# Patient Record
Sex: Female | Born: 1975 | Race: White | Hispanic: No | Marital: Single | Smoking: Current every day smoker
Health system: Southern US, Community
[De-identification: ages and names within clinical notes are randomized; demographics above are authoritative.]

## PROBLEM LIST (undated history)

## (undated) DIAGNOSIS — E785 Hyperlipidemia, unspecified: Secondary | ICD-10-CM

## (undated) DIAGNOSIS — M1990 Unspecified osteoarthritis, unspecified site: Secondary | ICD-10-CM

## (undated) DIAGNOSIS — R7303 Prediabetes: Secondary | ICD-10-CM

## (undated) DIAGNOSIS — E119 Type 2 diabetes mellitus without complications: Secondary | ICD-10-CM

## (undated) DIAGNOSIS — I10 Essential (primary) hypertension: Secondary | ICD-10-CM

---

## 1998-11-16 ENCOUNTER — Other Ambulatory Visit: Admission: RE | Admit: 1998-11-16 | Discharge: 1998-11-16 | Payer: Self-pay | Admitting: Family Medicine

## 2000-04-16 ENCOUNTER — Other Ambulatory Visit: Admission: RE | Admit: 2000-04-16 | Discharge: 2000-04-16 | Payer: Self-pay | Admitting: Obstetrics and Gynecology

## 2000-05-24 ENCOUNTER — Emergency Department (HOSPITAL_COMMUNITY): Admission: EM | Admit: 2000-05-24 | Discharge: 2000-05-24 | Payer: Self-pay | Admitting: Emergency Medicine

## 2000-07-04 ENCOUNTER — Emergency Department (HOSPITAL_COMMUNITY): Admission: EM | Admit: 2000-07-04 | Discharge: 2000-07-04 | Payer: Self-pay | Admitting: Emergency Medicine

## 2002-08-05 ENCOUNTER — Other Ambulatory Visit: Admission: RE | Admit: 2002-08-05 | Discharge: 2002-08-05 | Payer: Self-pay | Admitting: Gynecology

## 2004-05-11 ENCOUNTER — Ambulatory Visit: Payer: Self-pay | Admitting: Family Medicine

## 2004-06-21 ENCOUNTER — Ambulatory Visit: Payer: Self-pay | Admitting: Family Medicine

## 2005-09-11 ENCOUNTER — Emergency Department (HOSPITAL_COMMUNITY): Admission: EM | Admit: 2005-09-11 | Discharge: 2005-09-11 | Payer: Self-pay | Admitting: Family Medicine

## 2005-12-30 ENCOUNTER — Ambulatory Visit: Payer: Self-pay | Admitting: Internal Medicine

## 2006-05-05 ENCOUNTER — Emergency Department (HOSPITAL_COMMUNITY): Admission: EM | Admit: 2006-05-05 | Discharge: 2006-05-05 | Payer: Self-pay | Admitting: Family Medicine

## 2006-06-04 ENCOUNTER — Emergency Department (HOSPITAL_COMMUNITY): Admission: EM | Admit: 2006-06-04 | Discharge: 2006-06-04 | Payer: Self-pay | Admitting: Family Medicine

## 2006-09-11 ENCOUNTER — Other Ambulatory Visit: Admission: RE | Admit: 2006-09-11 | Discharge: 2006-09-11 | Payer: Self-pay | Admitting: Gynecology

## 2006-10-10 ENCOUNTER — Encounter: Payer: Self-pay | Admitting: Internal Medicine

## 2006-10-13 ENCOUNTER — Ambulatory Visit: Payer: Self-pay | Admitting: Family Medicine

## 2006-10-13 DIAGNOSIS — F329 Major depressive disorder, single episode, unspecified: Secondary | ICD-10-CM

## 2006-10-13 DIAGNOSIS — F3289 Other specified depressive episodes: Secondary | ICD-10-CM | POA: Insufficient documentation

## 2006-10-15 ENCOUNTER — Telehealth (INDEPENDENT_AMBULATORY_CARE_PROVIDER_SITE_OTHER): Payer: Self-pay | Admitting: Internal Medicine

## 2006-11-05 ENCOUNTER — Ambulatory Visit: Payer: Self-pay | Admitting: Family Medicine

## 2006-11-29 ENCOUNTER — Emergency Department (HOSPITAL_COMMUNITY): Admission: EM | Admit: 2006-11-29 | Discharge: 2006-11-29 | Payer: Self-pay | Admitting: Emergency Medicine

## 2006-12-11 ENCOUNTER — Telehealth (INDEPENDENT_AMBULATORY_CARE_PROVIDER_SITE_OTHER): Payer: Self-pay | Admitting: *Deleted

## 2006-12-11 ENCOUNTER — Ambulatory Visit: Payer: Self-pay | Admitting: Family Medicine

## 2006-12-16 ENCOUNTER — Telehealth (INDEPENDENT_AMBULATORY_CARE_PROVIDER_SITE_OTHER): Payer: Self-pay | Admitting: *Deleted

## 2006-12-17 ENCOUNTER — Encounter (INDEPENDENT_AMBULATORY_CARE_PROVIDER_SITE_OTHER): Payer: Self-pay | Admitting: Internal Medicine

## 2006-12-17 ENCOUNTER — Ambulatory Visit: Payer: Self-pay | Admitting: *Deleted

## 2006-12-17 ENCOUNTER — Telehealth (INDEPENDENT_AMBULATORY_CARE_PROVIDER_SITE_OTHER): Payer: Self-pay | Admitting: *Deleted

## 2006-12-31 ENCOUNTER — Ambulatory Visit: Payer: Self-pay | Admitting: *Deleted

## 2007-01-01 ENCOUNTER — Ambulatory Visit: Payer: Self-pay | Admitting: Family Medicine

## 2007-01-01 DIAGNOSIS — F411 Generalized anxiety disorder: Secondary | ICD-10-CM | POA: Insufficient documentation

## 2007-01-07 ENCOUNTER — Ambulatory Visit: Payer: Self-pay | Admitting: *Deleted

## 2007-02-02 ENCOUNTER — Encounter (INDEPENDENT_AMBULATORY_CARE_PROVIDER_SITE_OTHER): Payer: Self-pay | Admitting: *Deleted

## 2007-03-04 ENCOUNTER — Telehealth (INDEPENDENT_AMBULATORY_CARE_PROVIDER_SITE_OTHER): Payer: Self-pay | Admitting: Internal Medicine

## 2007-05-21 ENCOUNTER — Encounter (INDEPENDENT_AMBULATORY_CARE_PROVIDER_SITE_OTHER): Payer: Self-pay | Admitting: *Deleted

## 2007-10-31 ENCOUNTER — Emergency Department (HOSPITAL_COMMUNITY): Admission: EM | Admit: 2007-10-31 | Discharge: 2007-10-31 | Payer: Self-pay | Admitting: Emergency Medicine

## 2008-01-18 ENCOUNTER — Emergency Department (HOSPITAL_COMMUNITY): Admission: EM | Admit: 2008-01-18 | Discharge: 2008-01-18 | Payer: Self-pay | Admitting: Family Medicine

## 2008-02-16 ENCOUNTER — Ambulatory Visit: Payer: Self-pay | Admitting: Family Medicine

## 2008-02-16 DIAGNOSIS — R030 Elevated blood-pressure reading, without diagnosis of hypertension: Secondary | ICD-10-CM | POA: Insufficient documentation

## 2008-02-18 ENCOUNTER — Telehealth (INDEPENDENT_AMBULATORY_CARE_PROVIDER_SITE_OTHER): Payer: Self-pay | Admitting: Internal Medicine

## 2008-02-18 ENCOUNTER — Encounter (INDEPENDENT_AMBULATORY_CARE_PROVIDER_SITE_OTHER): Payer: Self-pay | Admitting: Internal Medicine

## 2008-03-01 ENCOUNTER — Ambulatory Visit: Payer: Self-pay | Admitting: Family Medicine

## 2008-03-08 ENCOUNTER — Ambulatory Visit: Payer: Self-pay | Admitting: Family Medicine

## 2008-03-08 ENCOUNTER — Telehealth (INDEPENDENT_AMBULATORY_CARE_PROVIDER_SITE_OTHER): Payer: Self-pay | Admitting: Internal Medicine

## 2008-03-08 ENCOUNTER — Encounter (INDEPENDENT_AMBULATORY_CARE_PROVIDER_SITE_OTHER): Payer: Self-pay | Admitting: Internal Medicine

## 2008-03-11 ENCOUNTER — Ambulatory Visit: Payer: Self-pay | Admitting: Licensed Clinical Social Worker

## 2008-03-17 ENCOUNTER — Ambulatory Visit: Payer: Self-pay | Admitting: Licensed Clinical Social Worker

## 2008-03-22 ENCOUNTER — Ambulatory Visit: Payer: Self-pay | Admitting: Family Medicine

## 2008-03-22 DIAGNOSIS — I10 Essential (primary) hypertension: Secondary | ICD-10-CM | POA: Insufficient documentation

## 2008-04-21 ENCOUNTER — Ambulatory Visit: Payer: Self-pay | Admitting: Family Medicine

## 2008-09-22 ENCOUNTER — Ambulatory Visit: Payer: Self-pay | Admitting: Family Medicine

## 2008-09-22 DIAGNOSIS — L255 Unspecified contact dermatitis due to plants, except food: Secondary | ICD-10-CM | POA: Insufficient documentation

## 2008-09-22 LAB — CONVERTED CEMR LAB
Basophils Relative: 0.3 % (ref 0.0–3.0)
Eosinophils Absolute: 0.6 10*3/uL (ref 0.0–0.7)
Eosinophils Relative: 3.6 % (ref 0.0–5.0)
Hemoglobin: 13.3 g/dL (ref 12.0–15.0)
Lymphocytes Relative: 15.3 % (ref 12.0–46.0)
MCHC: 34.3 g/dL (ref 30.0–36.0)
MCV: 87.2 fL (ref 78.0–100.0)
Monocytes Absolute: 0.1 10*3/uL (ref 0.1–1.0)
Neutro Abs: 12.9 10*3/uL — ABNORMAL HIGH (ref 1.4–7.7)
RBC: 4.44 M/uL (ref 3.87–5.11)
WBC: 16 10*3/uL — ABNORMAL HIGH (ref 4.5–10.5)

## 2008-09-26 ENCOUNTER — Ambulatory Visit: Payer: Self-pay | Admitting: Family Medicine

## 2011-01-31 LAB — POCT URINALYSIS DIP (DEVICE)
Nitrite: NEGATIVE
Specific Gravity, Urine: 1.015
pH: 7

## 2011-01-31 LAB — URINE CULTURE: Colony Count: NO GROWTH

## 2011-02-06 LAB — POCT RAPID STREP A: Streptococcus, Group A Screen (Direct): NEGATIVE

## 2011-06-01 ENCOUNTER — Emergency Department (HOSPITAL_COMMUNITY)
Admission: EM | Admit: 2011-06-01 | Discharge: 2011-06-01 | Disposition: A | Discharge status: Home / Self Care | Payer: Managed Care, Other (non HMO) | Source: Home / Self Care | Attending: Emergency Medicine | Admitting: Emergency Medicine

## 2011-06-01 ENCOUNTER — Encounter (HOSPITAL_COMMUNITY): Payer: Self-pay | Admitting: Emergency Medicine

## 2011-06-01 DIAGNOSIS — J329 Chronic sinusitis, unspecified: Secondary | ICD-10-CM

## 2011-06-01 DIAGNOSIS — N3090 Cystitis, unspecified without hematuria: Secondary | ICD-10-CM

## 2011-06-01 HISTORY — DX: Essential (primary) hypertension: I10

## 2011-06-01 LAB — POCT URINALYSIS DIP (DEVICE)
Glucose, UA: NEGATIVE mg/dL
Nitrite: NEGATIVE
Protein, ur: NEGATIVE mg/dL
Urobilinogen, UA: 0.2 mg/dL (ref 0.0–1.0)

## 2011-06-01 LAB — POCT PREGNANCY, URINE: Preg Test, Ur: NEGATIVE

## 2011-06-01 MED ORDER — FLUTICASONE PROPIONATE 50 MCG/ACT NA SUSP: 2.0000 | Freq: Every day | NASAL | Status: DC

## 2011-06-01 MED ORDER — PHENAZOPYRIDINE HCL 200 MG PO TABS: 200.0000 mg | ORAL_TABLET | Freq: Three times a day (TID) | ORAL | Status: AC | PRN

## 2011-06-01 MED ORDER — PSEUDOEPHEDRINE-GUAIFENESIN ER 120-1200 MG PO TB12: 1.0000 | ORAL_TABLET | Freq: Two times a day (BID) | ORAL | Status: DC

## 2011-06-01 MED ORDER — SULFAMETHOXAZOLE-TRIMETHOPRIM 800-160 MG PO TABS: 1.0000 | ORAL_TABLET | Freq: Two times a day (BID) | ORAL | Status: AC

## 2011-06-01 NOTE — ED Provider Notes (Signed)
History     CSN: [id number]  Arrival date & time 06/01/11  1201   First MD Initiated Contact with Patient 06/01/11 1248      Chief Complaint  Patient presents with  . Otalgia    (Consider location/radiation/quality/duration/timing/severity/associated sxs/prior treatment) HPI Comments: Patient with nasal congestion, rhinorrhea, frontal sinus pain/pressure, ear fullness, and bilateral ear pain over the past 3 days. Some postnasal drip and throat irritation. No purulent nasal discharge. No fevers, dental pain, nausea, vomiting, coughing, wheezing. Tried Alka-Seltzer sinus without relief. States this feels identical to previous sinus infections. Patient also reports 2 days of urinary urgency, frequency, dysuria, occasional lower back pain. No hematuria, cloudy, oderous urine. No fevers, vaginal bleeding, genital ulcers, vaginal discharge, vulvar itching, abdominal pain, fevers. No recent antibiotics. Patient states she has not been sexually active in over a year and a half. STDs no concern today. States this feels identical to previous UTIs. Has not tried anything for this.  ROS as noted in HPI. All other ROS negative.   Patient is a 36 y.o. female presenting with sinusitis and frequency. The history is provided by the patient. No language interpreter was used.  Sinusitis  This is a new problem. The current episode started more than 2 days ago. There has been no fever.  Urinary Frequency This is a new problem. The current episode started yesterday.    Past Medical History  Diagnosis Date  . Hypertension     History reviewed. No pertinent past surgical history.  History reviewed. No pertinent family history.  History  Substance Use Topics  . Smoking status: Current Everyday Smoker  . Smokeless tobacco: Not on file  . Alcohol Use: No    OB History    Grav Para Term Preterm Abortions TAB SAB Ect Mult Living                  Review of Systems  Genitourinary: Positive for  frequency.    Allergies  Codeine sulfate; Latex; and Penicillins  Home Medications   Current Outpatient Rx  Name Route Sig Dispense Refill  . FLUTICASONE PROPIONATE 50 MCG/ACT NA SUSP Nasal Place 2 sprays into the nose daily. 16 g 0  . PHENAZOPYRIDINE HCL 200 MG PO TABS Oral Take 1 tablet (200 mg total) by mouth 3 (three) times daily as needed for pain. 6 tablet 0  . PSEUDOEPHEDRINE-GUAIFENESIN ER [phone] MG PO TB12 Oral Take 1 tablet by mouth 2 (two) times daily. 20 each 0  . SULFAMETHOXAZOLE-TRIMETHOPRIM 800-160 MG PO TABS Oral Take 1 tablet by mouth 2 (two) times daily. 6 tablet 0    BP 119/83  Pulse 78  Temp(Src) 98.6 F (37 C) (Oral)  Resp 16  SpO2 100%  LMP 03/01/2011  Physical Exam  Nursing note and vitals reviewed. Constitutional: She is oriented to person, place, and time. She appears well-developed and well-nourished.  HENT:  Head: Normocephalic and atraumatic.  Right Ear: Tympanic membrane normal.  Left Ear: Tympanic membrane normal.  Nose: Mucosal edema and rhinorrhea present. No epistaxis. Right sinus exhibits maxillary sinus tenderness. Right sinus exhibits no frontal sinus tenderness. Left sinus exhibits maxillary sinus tenderness. Left sinus exhibits no frontal sinus tenderness.  Mouth/Throat: Uvula is midline and mucous membranes are normal. Posterior oropharyngeal erythema present. No oropharyngeal exudate.       Serous effusion left TM  Eyes: Conjunctivae and EOM are normal.  Neck: Normal range of motion. Neck supple.  Cardiovascular: Normal rate, regular rhythm and normal heart sounds.  Pulmonary/Chest: Effort normal and breath sounds normal. No respiratory distress. She has no wheezes. She has no rales.  Abdominal: Soft. Bowel sounds are normal. She exhibits no distension. There is tenderness in the suprapubic area. There is no rebound, no guarding and no CVA tenderness.  Musculoskeletal: Normal range of motion.  Lymphadenopathy:    She has no cervical  adenopathy.  Neurological: She is alert and oriented to person, place, and time.  Skin: Skin is warm and dry. No rash noted.  Psychiatric: She has a normal mood and affect. Her behavior is normal. Judgment and thought content normal.    ED Course  Procedures (including critical care time)  No results found.  Results for orders placed during the hospital encounter of 06/01/11  POCT URINALYSIS DIP (DEVICE)      Component Value Range   Glucose, UA NEGATIVE  NEGATIVE (mg/dL)   Bilirubin Urine SMALL (*) NEGATIVE    Ketones, ur TRACE (*) NEGATIVE (mg/dL)   Specific Gravity, Urine 1.020  1.005 - 1.030    Hgb urine dipstick NEGATIVE  NEGATIVE    pH 6.0  5.0 - 8.0    Protein, ur NEGATIVE  NEGATIVE (mg/dL)   Urobilinogen, UA 0.2  0.0 - 1.0 (mg/dL)   Nitrite NEGATIVE  NEGATIVE    Leukocytes, UA NEGATIVE  NEGATIVE   POCT PREGNANCY, URINE      Component Value Range   Preg Test, Ur NEGATIVE  NEGATIVE     1. Cystitis   2. Sinusitis      MDM  Previous chart, labs reviewed. History of hematuria,. Treated for UTI in 2009, urine culture negative. No history of gonorrhea, Chlamydia, BV, trich.  Patient states that she's not actually active in over a year and a half. Has no vaginal complaints. STDs no concern today. udip noted. Will send urine off for culture, and treat as UTI. Patient also with early sinus infection. No fevers >102, has had sx for < 10 days, no h/o double sickening. No historical or objective evidence of bacterial infection. No indication for abx. Will start flonase, mucinex-d, increase fluids, nasal saline irrigation,  tylenol/motrin prn pain. Discussed MDM and plan with pt. Pt agrees with plan and will f/u with PMD prn.      Luiz Blare, MD 06/01/11 1327

## 2011-06-01 NOTE — ED Notes (Signed)
Onset 3 days ago of sinus headache and sinus drainage, then ear hurting, then noted lower back pain and frequent urination.  Denies urinary pain.  Pain predominantly right lower back

## 2011-06-02 LAB — URINE CULTURE

## 2012-09-04 ENCOUNTER — Other Ambulatory Visit (HOSPITAL_COMMUNITY)
Admission: RE | Admit: 2012-09-04 | Discharge: 2012-09-04 | Disposition: A | Discharge status: Home / Self Care | Payer: Managed Care, Other (non HMO) | Source: Ambulatory Visit | Attending: Obstetrics and Gynecology | Admitting: Obstetrics and Gynecology

## 2012-09-04 ENCOUNTER — Other Ambulatory Visit: Payer: Self-pay | Admitting: Obstetrics and Gynecology

## 2012-09-04 DIAGNOSIS — Z1151 Encounter for screening for human papillomavirus (HPV): Secondary | ICD-10-CM | POA: Insufficient documentation

## 2012-09-04 DIAGNOSIS — Z01419 Encounter for gynecological examination (general) (routine) without abnormal findings: Secondary | ICD-10-CM | POA: Insufficient documentation

## 2012-09-04 DIAGNOSIS — R87810 Cervical high risk human papillomavirus (HPV) DNA test positive: Secondary | ICD-10-CM | POA: Insufficient documentation

## 2013-01-01 ENCOUNTER — Encounter: Payer: Self-pay | Admitting: Dietician

## 2013-01-01 ENCOUNTER — Encounter: Payer: Managed Care, Other (non HMO) | Attending: Gynecology | Admitting: Dietician

## 2013-01-01 VITALS — Ht 66.0 in | Wt 247.1 lb

## 2013-01-01 DIAGNOSIS — R7309 Other abnormal glucose: Secondary | ICD-10-CM | POA: Insufficient documentation

## 2013-01-01 DIAGNOSIS — Z713 Dietary counseling and surveillance: Secondary | ICD-10-CM | POA: Insufficient documentation

## 2013-01-01 NOTE — Patient Instructions (Addendum)
Consider eating 3 meals and 2-3 snacks per day.  Try to eat every 3-5 hours you are awake. Eat protein with carbohydrates for snacks. Fill half of your plate with vegetables and limit starch to a quarter of the plate. Switch Coke to water or coffee. (Try seltzer) Aim to get 30 minutes of exercise 5 days a week.

## 2013-01-01 NOTE — Progress Notes (Signed)
  Medical Nutrition Therapy:  Appt start time: 0900 end time:  1000.   Assessment:  Primary concerns today: Kelsey Cuevas is here today because her doctor recommended that she talk to a dietitian about her high blood sugar levels (pre-diabetes). Her Hgb A1C was 6.3% in early August.   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 States that her feet swell when she sits on day at work and does not have swelling when she is moving more on the weekend.   States that she doesn't feel hungry during the day but will feel full if she eats too much. States that she doesn't usually eat too much but feels that she eats too many convenience foods.   MEDICATIONS: see list   DIETARY INTAKE:  Usual eating pattern includes 2 meals and 0 snacks per day.  24-hr recall:  B ( AM): usually instant oatmeal with coke   Snk ( AM): rarely  L ( PM): pasta OR sandwich with ham and cheese and chips with coke Snk ( PM): none D ( PM): sometimes bowl of cereal  Snk ( PM): none Beverages: 3 cokes (16 oz each) and drinks ~32-48 oz water   Usual physical activity: nothing M-Thursday, 30 min walks or more activities such as gardening or swimming on weekend   Estimated energy needs: 1800 calories 200 g carbohydrates 135 g protein 50 g fat  Progress Towards Goal(s):  In progress.   Nutritional Diagnosis:  NB-1.1 Food and nutrition-related knowledge deficit As related to excess soda consumption and meal skipping.  As evidenced by diet recall and Hgb A1C of 6.3% .    Intervention:  Nutrition counseling provided. Discussed the pathophysiology of insulin resistance and factors that affect blood sugar such as carbohydrate foods, weight gain, stress, and exercise. Recommended eating more frequently to distribute carbohydrates throughout the day. Recommended limiting coke and adding exercise wherever possible during the day.    Plan: Consider eating 3 meals and 2-3 snacks per day.  Try to eat every 3-5 hours you are awake. Eat protein with carbohydrates for snacks. Fill half of your plate with vegetables and limit starch to a quarter of the plate. Switch Coke to water or coffee. (Try seltzer) Aim to get 30 minutes of exercise 5 days a week.   Handouts given during visit include:  Living Well with Diabetes  15g CHO Snacks  MyPlate Handout  Monitoring/Evaluation:  Dietary intake, exercise, and body weight in 6 week(s).

## 2013-01-25 ENCOUNTER — Other Ambulatory Visit: Payer: Self-pay | Admitting: Internal Medicine

## 2013-01-25 DIAGNOSIS — E221 Hyperprolactinemia: Secondary | ICD-10-CM

## 2013-01-29 ENCOUNTER — Ambulatory Visit
Admission: RE | Admit: 2013-01-29 | Discharge: 2013-01-29 | Disposition: A | Discharge status: Home / Self Care | Payer: Managed Care, Other (non HMO) | Source: Ambulatory Visit | Attending: Internal Medicine | Admitting: Internal Medicine

## 2013-01-29 DIAGNOSIS — E221 Hyperprolactinemia: Secondary | ICD-10-CM

## 2013-01-29 MED ORDER — GADOBENATE DIMEGLUMINE 529 MG/ML IV SOLN
10.0000 mL | Freq: Once | INTRAVENOUS | Status: AC | PRN
  Administered 2013-01-29: 10 mL via INTRAVENOUS

## 2013-02-12 ENCOUNTER — Ambulatory Visit: Payer: Managed Care, Other (non HMO) | Admitting: Dietician

## 2013-07-13 ENCOUNTER — Other Ambulatory Visit: Payer: Self-pay | Admitting: Nurse Practitioner

## 2013-07-13 ENCOUNTER — Ambulatory Visit
Admission: RE | Admit: 2013-07-13 | Discharge: 2013-07-13 | Disposition: A | Discharge status: Home / Self Care | Payer: Managed Care, Other (non HMO) | Source: Ambulatory Visit | Attending: Nurse Practitioner | Admitting: Nurse Practitioner

## 2013-07-13 DIAGNOSIS — M25511 Pain in right shoulder: Secondary | ICD-10-CM

## 2013-09-24 ENCOUNTER — Other Ambulatory Visit: Payer: Self-pay | Admitting: Internal Medicine

## 2013-09-24 DIAGNOSIS — Z1231 Encounter for screening mammogram for malignant neoplasm of breast: Secondary | ICD-10-CM

## 2013-10-08 ENCOUNTER — Ambulatory Visit
Admission: RE | Admit: 2013-10-08 | Discharge: 2013-10-08 | Disposition: A | Discharge status: Home / Self Care | Payer: Managed Care, Other (non HMO) | Source: Ambulatory Visit | Attending: Internal Medicine | Admitting: Internal Medicine

## 2013-10-08 ENCOUNTER — Encounter (INDEPENDENT_AMBULATORY_CARE_PROVIDER_SITE_OTHER): Payer: Self-pay

## 2013-10-08 DIAGNOSIS — Z1231 Encounter for screening mammogram for malignant neoplasm of breast: Secondary | ICD-10-CM

## 2013-10-11 ENCOUNTER — Other Ambulatory Visit: Payer: Self-pay | Admitting: Internal Medicine

## 2013-10-11 DIAGNOSIS — R928 Other abnormal and inconclusive findings on diagnostic imaging of breast: Secondary | ICD-10-CM

## 2013-10-15 ENCOUNTER — Other Ambulatory Visit: Payer: Self-pay | Admitting: Obstetrics and Gynecology

## 2013-10-15 ENCOUNTER — Ambulatory Visit
Admission: RE | Admit: 2013-10-15 | Discharge: 2013-10-15 | Disposition: A | Discharge status: Home / Self Care | Payer: Private Health Insurance - Indemnity | Source: Ambulatory Visit | Attending: Internal Medicine | Admitting: Internal Medicine

## 2013-10-15 ENCOUNTER — Other Ambulatory Visit (HOSPITAL_COMMUNITY)
Admission: RE | Admit: 2013-10-15 | Discharge: 2013-10-15 | Disposition: A | Discharge status: Home / Self Care | Payer: Private Health Insurance - Indemnity | Source: Ambulatory Visit | Attending: Obstetrics and Gynecology | Admitting: Obstetrics and Gynecology

## 2013-10-15 DIAGNOSIS — R87810 Cervical high risk human papillomavirus (HPV) DNA test positive: Secondary | ICD-10-CM | POA: Insufficient documentation

## 2013-10-15 DIAGNOSIS — Z1151 Encounter for screening for human papillomavirus (HPV): Secondary | ICD-10-CM | POA: Insufficient documentation

## 2013-10-15 DIAGNOSIS — R928 Other abnormal and inconclusive findings on diagnostic imaging of breast: Secondary | ICD-10-CM

## 2013-10-15 DIAGNOSIS — Z01419 Encounter for gynecological examination (general) (routine) without abnormal findings: Secondary | ICD-10-CM | POA: Insufficient documentation

## 2013-10-19 LAB — CYTOLOGY - PAP

## 2015-08-20 ENCOUNTER — Emergency Department (HOSPITAL_COMMUNITY): Payer: Managed Care, Other (non HMO)

## 2015-08-20 ENCOUNTER — Encounter (HOSPITAL_COMMUNITY): Payer: Self-pay | Admitting: Oncology

## 2015-08-20 ENCOUNTER — Emergency Department (HOSPITAL_COMMUNITY)
Admission: EM | Admit: 2015-08-20 | Discharge: 2015-08-20 | Disposition: A | Discharge status: Home / Self Care | Payer: Managed Care, Other (non HMO) | Attending: Emergency Medicine | Admitting: Emergency Medicine

## 2015-08-20 DIAGNOSIS — R0789 Other chest pain: Secondary | ICD-10-CM

## 2015-08-20 DIAGNOSIS — E785 Hyperlipidemia, unspecified: Secondary | ICD-10-CM | POA: Diagnosis not present

## 2015-08-20 DIAGNOSIS — I10 Essential (primary) hypertension: Secondary | ICD-10-CM | POA: Diagnosis not present

## 2015-08-20 DIAGNOSIS — Z79899 Other long term (current) drug therapy: Secondary | ICD-10-CM | POA: Insufficient documentation

## 2015-08-20 DIAGNOSIS — R1012 Left upper quadrant pain: Secondary | ICD-10-CM | POA: Diagnosis not present

## 2015-08-20 DIAGNOSIS — R1013 Epigastric pain: Secondary | ICD-10-CM | POA: Insufficient documentation

## 2015-08-20 DIAGNOSIS — F17200 Nicotine dependence, unspecified, uncomplicated: Secondary | ICD-10-CM | POA: Diagnosis not present

## 2015-08-20 DIAGNOSIS — Z91040 Latex allergy status: Secondary | ICD-10-CM | POA: Insufficient documentation

## 2015-08-20 DIAGNOSIS — R079 Chest pain, unspecified: Secondary | ICD-10-CM | POA: Diagnosis present

## 2015-08-20 DIAGNOSIS — Z880 Allergy status to penicillin: Secondary | ICD-10-CM | POA: Diagnosis not present

## 2015-08-20 DIAGNOSIS — E119 Type 2 diabetes mellitus without complications: Secondary | ICD-10-CM | POA: Insufficient documentation

## 2015-08-20 HISTORY — DX: Type 2 diabetes mellitus without complications: E11.9

## 2015-08-20 HISTORY — DX: Hyperlipidemia, unspecified: E78.5

## 2015-08-20 LAB — CBC
HEMATOCRIT: 42.9 % (ref 36.0–46.0)
Hemoglobin: 14.1 g/dL (ref 12.0–15.0)
MCH: 29.2 pg (ref 26.0–34.0)
MCHC: 32.9 g/dL (ref 30.0–36.0)
MCV: 88.8 fL (ref 78.0–100.0)
PLATELETS: 334 10*3/uL (ref 150–400)
RBC: 4.83 MIL/uL (ref 3.87–5.11)
RDW: 14.9 % (ref 11.5–15.5)
WBC: 13.1 10*3/uL — AB (ref 4.0–10.5)

## 2015-08-20 LAB — LIPASE, BLOOD: LIPASE: 43 U/L (ref 11–51)

## 2015-08-20 LAB — BASIC METABOLIC PANEL
Anion gap: 7 (ref 5–15)
BUN: 12 mg/dL (ref 6–20)
CO2: 24 mmol/L (ref 22–32)
Calcium: 9.2 mg/dL (ref 8.9–10.3)
Chloride: 107 mmol/L (ref 101–111)
Creatinine, Ser: 0.82 mg/dL (ref 0.44–1.00)
Glucose, Bld: 112 mg/dL — ABNORMAL HIGH (ref 65–99)
POTASSIUM: 3.8 mmol/L (ref 3.5–5.1)
SODIUM: 138 mmol/L (ref 135–145)

## 2015-08-20 LAB — I-STAT TROPONIN, ED
TROPONIN I, POC: 0 ng/mL (ref 0.00–0.08)
Troponin i, poc: 0 ng/mL (ref 0.00–0.08)

## 2015-08-20 MED ORDER — FAMOTIDINE IN NACL 20-0.9 MG/50ML-% IV SOLN
20.0000 mg | Freq: Once | INTRAVENOUS | Status: AC
  Administered 2015-08-20: 20 mg via INTRAVENOUS
  Filled 2015-08-20: qty 50

## 2015-08-20 MED ORDER — KETOROLAC TROMETHAMINE 30 MG/ML IJ SOLN
30.0000 mg | Freq: Once | INTRAMUSCULAR | Status: AC
  Administered 2015-08-20: 30 mg via INTRAVENOUS
  Filled 2015-08-20: qty 1

## 2015-08-20 MED ORDER — GI COCKTAIL ~~LOC~~
30.0000 mL | Freq: Once | ORAL | Status: AC
  Administered 2015-08-20: 30 mL via ORAL
  Filled 2015-08-20: qty 30

## 2015-08-20 MED ORDER — SODIUM CHLORIDE 0.9 % IV BOLUS (SEPSIS)
1000.0000 mL | Freq: Once | INTRAVENOUS | Status: AC
  Administered 2015-08-20: 1000 mL via INTRAVENOUS

## 2015-08-20 NOTE — ED Notes (Signed)
Pt has been experiencing intermittent left chest, (under left breast) pain x 3 weeks.  Last night pt reports being dizzy.  Today pt states that the pain has been constant.  Rates pain 8/10, sharp and stabbing in nature.  Swelling noted under left breast.  Denies radiation of pain, nausea, diaphoresis or weakness.

## 2015-08-20 NOTE — Discharge Instructions (Signed)

## 2015-08-20 NOTE — ED Provider Notes (Signed)
CSN: [id number]     Arrival date & time 08/20/15  1927 History   First MD Initiated Contact with Patient 08/20/15 2022     Chief Complaint  Patient presents with  . Chest Pain   HPI Comments: 40 year old female presents with chest pain. She states she has felt a pressure under her L breast for the past 3 weeks. She thought it was a gas pain however has not tried any medicines to help make it better. The pain has been intermittent however in the past day it has become constant. It does not radiate. It is not positional or worse with movement. She has never had this before. PMH significant for HTN, HLD, and pre-diabetes. She smokes 1 pack a day. No immediate family hx of CAD. Denies fever, chills, SOB, cough, abdominal pain, N/V/D.   Patient is a 40 y.o. female presenting with chest pain.  Chest Pain Associated symptoms: no abdominal pain, no cough, no fever, no nausea, no shortness of breath and not vomiting     Past Medical History  Diagnosis Date  . Hypertension   . Hyperlipidemia   . Diabetes mellitus without complication (HCC)     states she was pre diabetic   History reviewed. No pertinent past surgical history. Family History  Problem Relation Age of Onset  . COPD Other   . Hypertension Other   . GI Disease Other    Social History  Substance Use Topics  . Smoking status: Current Every Day Smoker  . Smokeless tobacco: Never Used  . Alcohol Use: Yes     Comment: rare   OB History    No data available     Review of Systems  Constitutional: Negative for fever and chills.  Respiratory: Negative for cough and shortness of breath.   Cardiovascular: Positive for chest pain.  Gastrointestinal: Negative for nausea, vomiting, abdominal pain and diarrhea.  Genitourinary: Negative for dysuria.  All other systems reviewed and are negative.   Allergies  Codeine sulfate; Latex; and Penicillins  Home Medications   Prior to Admission medications   Medication Sig Start Date End  Date Taking? Authorizing Provider  atorvastatin (LIPITOR) 10 MG tablet Take 10 mg by mouth daily. 06/06/15  Yes Historical Provider, MD  cabergoline (DOSTINEX) 0.5 MG tablet Take 0.25 mg by mouth 2 (two) times a week. Takes on Wed and Sat.   Yes Historical Provider, MD  losartan (COZAAR) 25 MG tablet Take 25 mg by mouth daily. 06/06/15  Yes Historical Provider, MD  Multiple Vitamins-Minerals (MULTIVITAMIN & MINERAL PO) Take 1 tablet by mouth daily.   Yes Historical Provider, MD  Vitamin D, Cholecalciferol, 400 units CHEW Chew 1 tablet by mouth daily.   Yes Historical Provider, MD  fluticasone (FLONASE) 50 MCG/ACT nasal spray Place 2 sprays into the nose daily. 06/01/11 05/31/12  Domenick GongAshley Mortenson, MD  Pseudoephedrine-Guaifenesin (MUCINEX D) [phone] MG TB12 Take 1 tablet by mouth 2 (two) times daily. Patient not taking: Reported on 08/20/2015 06/01/11   Domenick GongAshley Mortenson, MD   BP 118/91 mmHg  Pulse 86  Temp(Src) 98.4 F (36.9 C) (Oral)  Resp 20  SpO2 100%  LMP 08/16/2015   Physical Exam  Constitutional: She is oriented to person, place, and time. She appears well-developed and well-nourished. No distress.  HENT:  Head: Normocephalic and atraumatic.  Eyes: Conjunctivae are normal. Pupils are equal, round, and reactive to light. Right eye exhibits no discharge. Left eye exhibits no discharge. No scleral icterus.  Neck: Normal range  of motion.  Cardiovascular: Normal rate and regular rhythm.  Exam reveals no gallop and no friction rub.   No murmur heard. Pulmonary/Chest: Effort normal and breath sounds normal. No respiratory distress. She has no wheezes. She has no rales. She exhibits tenderness.  L side chest wall tenderness  Abdominal: Soft. Bowel sounds are normal. She exhibits no distension and no mass. There is tenderness. There is no rebound and no guarding.  Epigastric and LUQ pain  Neurological: She is alert and oriented to person, place, and time.  Skin: Skin is warm and dry.   Psychiatric: She has a normal mood and affect.    ED Course  Procedures (including critical care time) Labs Review Labs Reviewed  BASIC METABOLIC PANEL - Abnormal; Notable for the following:    Glucose, Bld 112 (*)    All other components within normal limits  CBC - Abnormal; Notable for the following:    WBC 13.1 (*)    All other components within normal limits  LIPASE, BLOOD  I-STAT TROPOININ, ED  Rosezena Sensor, ED    Imaging Review Dg Chest 2 View  08/20/2015  CLINICAL DATA:  Left lower chest pain with swelling for 3 weeks. No known injury. History of hypertension and pre diabetes. EXAM: CHEST  2 VIEW COMPARISON:  08/07/2012. FINDINGS: The heart size and mediastinal contours are normal. The lungs are clear. There is no pleural effusion or pneumothorax. No acute osseous findings are identified. IMPRESSION: Stable chest.  No active cardiopulmonary process. Electronically Signed   By: Carey Bullocks M.D.   On: 08/20/2015 20:58   I have personally reviewed and evaluated these images and lab results as part of my medical decision-making.   EKG Interpretation   Date/Time:  Sunday August 20 2015 20:00:08 EDT Ventricular Rate:  83 PR Interval:  121 QRS Duration: 96 QT Interval:  383 QTC Calculation: 450 R Axis:   62 Text Interpretation:  Sinus rhythm Inferior Q waves noted Lateral Q waves  noted No old tracing to compare Confirmed by NANAVATI, MD, Janey Genta [phone])  on 08/20/2015 9:19:38 PM     Meds given in ED:  Medications  sodium chloride 0.9 % bolus 1,000 mL (0 mLs Intravenous Stopped 08/20/15 2247)  famotidine (PEPCID) IVPB 20 mg premix (0 mg Intravenous Stopped 08/20/15 2203)  gi cocktail (Maalox,Lidocaine,Donnatal) (30 mLs Oral Given 08/20/15 2132)  ketorolac (TORADOL) 30 MG/ML injection 30 mg (30 mg Intravenous Given 08/20/15 2132)    Discharge Medication List as of 08/20/2015 11:10 PM       MDM   Final diagnoses:  Atypical chest pain   40 year old female who  presents with atypical chest pain for 3 weeks. Her pain is reproducible with palpation of chest wall. She is also tender in the epigastric and LUQ. GI cocktail, Pepcid, Toradol given with some relief. Discussed with patient that her symptoms are not likely coming from her heart. 2 troponins were neg with normal EKG. CXR was unremarkable. CBC showed elevation of WBC (13.1).  Patient is non-toxic, NAD, stable VS. Heart score of 2. Patient is safe for discharge. Return precautions given. Patient informed of clinical course, understand medical decision-making process, and agree with plan.   Bethel Born, PA-C 08/21/15 [id number]  Derwood Kaplan, MD 08/22/15 [phone]

## 2015-09-29 ENCOUNTER — Telehealth: Payer: Self-pay | Admitting: Oncology

## 2015-09-29 ENCOUNTER — Encounter: Payer: Self-pay | Admitting: Oncology

## 2015-09-29 NOTE — Telephone Encounter (Signed)
Dr Truett PernaSherrill reviewed packet and provided date, Verified address and insurance, faxed referring provider date and time, mailed new pt packet

## 2015-10-26 ENCOUNTER — Other Ambulatory Visit: Payer: Self-pay | Admitting: Obstetrics and Gynecology

## 2015-10-26 ENCOUNTER — Other Ambulatory Visit (HOSPITAL_COMMUNITY)
Admission: RE | Admit: 2015-10-26 | Discharge: 2015-10-26 | Disposition: A | Discharge status: Home / Self Care | Payer: Managed Care, Other (non HMO) | Source: Ambulatory Visit | Attending: Obstetrics and Gynecology | Admitting: Obstetrics and Gynecology

## 2015-10-26 DIAGNOSIS — Z01419 Encounter for gynecological examination (general) (routine) without abnormal findings: Secondary | ICD-10-CM | POA: Insufficient documentation

## 2015-10-26 DIAGNOSIS — Z1151 Encounter for screening for human papillomavirus (HPV): Secondary | ICD-10-CM | POA: Diagnosis present

## 2015-10-30 ENCOUNTER — Ambulatory Visit (HOSPITAL_BASED_OUTPATIENT_CLINIC_OR_DEPARTMENT_OTHER): Payer: Managed Care, Other (non HMO)

## 2015-10-30 ENCOUNTER — Ambulatory Visit (HOSPITAL_BASED_OUTPATIENT_CLINIC_OR_DEPARTMENT_OTHER): Payer: Managed Care, Other (non HMO) | Admitting: Oncology

## 2015-10-30 VITALS — BP 118/78 | HR 75 | Temp 98.4°F | Resp 18 | Ht 66.0 in | Wt 221.1 lb

## 2015-10-30 DIAGNOSIS — I10 Essential (primary) hypertension: Secondary | ICD-10-CM

## 2015-10-30 DIAGNOSIS — Z720 Tobacco use: Secondary | ICD-10-CM

## 2015-10-30 DIAGNOSIS — D72829 Elevated white blood cell count, unspecified: Secondary | ICD-10-CM

## 2015-10-30 LAB — CBC WITH DIFFERENTIAL/PLATELET
BASO%: 0.7 % (ref 0.0–2.0)
Basophils Absolute: 0.1 10*3/uL (ref 0.0–0.1)
EOS%: 1.5 % (ref 0.0–7.0)
Eosinophils Absolute: 0.2 10*3/uL (ref 0.0–0.5)
HEMATOCRIT: 43 % (ref 34.8–46.6)
HEMOGLOBIN: 14.2 g/dL (ref 11.6–15.9)
LYMPH#: 3.2 10*3/uL (ref 0.9–3.3)
LYMPH%: 26.1 % (ref 14.0–49.7)
MCH: 29.7 pg (ref 25.1–34.0)
MCHC: 33.1 g/dL (ref 31.5–36.0)
MCV: 89.8 fL (ref 79.5–101.0)
MONO#: 0.6 10*3/uL (ref 0.1–0.9)
MONO%: 4.8 % (ref 0.0–14.0)
NEUT%: 66.9 % (ref 38.4–76.8)
NEUTROS ABS: 8.2 10*3/uL — AB (ref 1.5–6.5)
Platelets: 325 10*3/uL (ref 145–400)
RBC: 4.79 10*6/uL (ref 3.70–5.45)
RDW: 15.2 % — AB (ref 11.2–14.5)
WBC: 12.3 10*3/uL — AB (ref 3.9–10.3)

## 2015-10-30 LAB — CHCC SMEAR

## 2015-10-30 LAB — CYTOLOGY - PAP

## 2015-10-30 LAB — DRAW EXTRA CLOT TUBE

## 2015-10-30 NOTE — Progress Notes (Signed)
[facility]   Referring ZO:[id number]:Kelsey Cuevas  Kelsey Cuevas 40 y.o.  12/08/1975    Reason for Referral: Leukocytosis   HPI: [name] is followed by Dr. Donette Cuevas for internal medicine care. She feels well. She was seen in the emergency room in April for left-sided "rib "pain. She was diagnosed with possible pleurisy and treated with a steroid Dosepak. She reports the pain has improved. She continues to have tenderness at the lower left anterior ribs. She denies trauma. When she saw Dr. Donette Cuevas on 06/07/2015 the hemoglobin returned at 14, platelets 335,000, MCV 88.4, and the white count was mildly elevated at 15.4. A repeat CBC on 09/18/2015 found the white count 11.5 with an absolute focal 7.5 and an absolute lymphocyte count of 3.3. Hepatitis C virus and HIV serologies were negative.  She reports no known history of an elevated white count. No recent infection.    Past Medical History  Diagnosis Date  . Hypertension   . Hyperlipidemia   . Diabetes mellitus without complication (HCC)     states she was pre diabetic    .  Basal cell carcinoma removed from the back in 2016   .  G0 P0,continues to have a menstrual cycle   .  Hyper prolactinemia   .  "Cortisone "injection in the right shoulder approximately 2 years ago. Past surgical history: -Wisdom tooth surgery  Medications: Reviewed  Allergies:  Allergies  Allergen Reactions  . Codeine Sulfate     REACTION: hallucinations  . Latex   . Penicillins     REACTION: Throat swelling    Family history: A maternal great aunt had breast cancer.  Social History:   [place] in [place]. She works in an office occupation. She has smoked one pack of cigarettes per day since age 40. Rare alcohol use. No transfusion history.  History  Alcohol Use  . Yes    Comment: rare    History  Smoking status  . Current Every Day Smoker  Smokeless tobacco  . Never Used      ROS:    Positives include:Thinning of the eyebrows and scalp hair, tenderness at the left anterolateral costal margin since April 2017  A complete ROS was otherwise negative.  Physical Exam:  Blood pressure 118/78, pulse 75, temperature 98.4 F (36.9 C), temperature source Oral, resp. rate 18, height 5\' 6"  (1.676 m), weight 221 lb 1.6 oz (100.29 kg), SpO2 99 %.  HEENT: Oropharynx without visible mass, neck without mass Lungs: Clear bilaterally Cardiac: Regular rate and rhythm Abdomen: No hepatosplenomegaly, no mass, nontender  Vascular: No leg edema Lymph nodes: No cervical, supraclavicular, axillary, or inguinal nodes Neurologic: Alert and oriented, the motor exam appears intact in the upper and lower extremities Skin: No rash, multiple tattoos Musculoskeletal: Mild tenderness over the low anterior ribs, no mass   LAB:  CBC: 10/30/2015-hemolymph 14.2, platelets 325,000, white count 12.3, ANC 8.2, absolute lymphocyte count 3.2 Peripheral blood smear: The platelets are normal in number, the white cell morphology is unremarkable. The majority of the white cells are mature neutrophils, no blasts. few ovalocytes. The polychromasia is not increased.   08/20/2015-WBC 13.1, hemolymph 14.1, platelets 334,000, 09/22/2008-white count 16.0, hemoglobin 13.3, platelets 372,000, ANC 12.9 CMP      Component Value Date/Time   NA 138 08/20/2015 2000   K 3.8 08/20/2015 2000   CL 107 08/20/2015 2000   CO2 24 08/20/2015 2000   GLUCOSE 112* 08/20/2015 2000  BUN 12 08/20/2015 2000   CREATININE 0.82 08/20/2015 2000   CALCIUM 9.2 08/20/2015 2000   GFRNONAA >60 08/20/2015 2000   GFRAA >60 08/20/2015 2000      Assessment/Plan:   1. Leukocytosis  Mild leukocytosis and neutrophilia on repeat CBC determinations  2.   Elevated prolactin level  3.   Hypertension  4.   Ongoing tobacco use  5.   Tenderness at the low anterolateral ribs   Disposition:   [name] is referred for evaluation  of leukocytosis. The white count has been elevated chronically based on review of the available records today. I have a low clinical suspicion for a myeloproliferative disorder or another primary hematologic condition. I suspect the leukocytosis is related to ongoing tobacco use.  We obtained a ferritin level today per the request of Dr. Dion BodyVarnado.  [name] plans to continue clinical follow-up with Dr. Donette Cuevas. I recommend a repeat CBC at the time of her yearly physical and as a follow-up if she discontinues smoking. I will be glad to see her again if she develops progressive leukocytosis or another hematologic abnormality.  She received smoking cessation materials today. She will follow-up with Dr. Donette Cuevas to evaluate the alopecia.  Thornton PapasSHERRILL, Ruddy Swire, MD  10/30/2015, 3:19 PM

## 2015-10-31 LAB — FERRITIN: Ferritin: 17 ng/ml (ref 9–269)

## 2015-11-02 ENCOUNTER — Telehealth: Payer: Self-pay | Admitting: *Deleted

## 2015-11-02 NOTE — Telephone Encounter (Signed)
-----   Message from Ladene ArtistGary B Sherrill, MD sent at 10/31/2015  6:38 PM EDT ----- Copy to gyn and Dr. Donette LarryHusain Please call her iron stores borderline low, can f/u with gyn and Dr.Husain

## 2015-11-02 NOTE — Telephone Encounter (Signed)
Per Dr. Truett PernaSherrill, pt notified that iron stores are borderline low and to f/u with GYN and Dr. Donette LarryHusain.  Pt states that her GYN has already called her and prescribed an iron pill for her to take.  Pt appreciative of call.

## 2016-08-30 ENCOUNTER — Other Ambulatory Visit: Payer: Self-pay | Admitting: Internal Medicine

## 2016-08-30 DIAGNOSIS — Z1231 Encounter for screening mammogram for malignant neoplasm of breast: Secondary | ICD-10-CM

## 2016-09-23 ENCOUNTER — Ambulatory Visit
Admission: RE | Admit: 2016-09-23 | Discharge: 2016-09-23 | Disposition: A | Discharge status: Home / Self Care | Payer: Managed Care, Other (non HMO) | Source: Ambulatory Visit | Attending: Internal Medicine | Admitting: Internal Medicine

## 2016-09-23 ENCOUNTER — Ambulatory Visit: Payer: Managed Care, Other (non HMO)

## 2016-09-23 DIAGNOSIS — Z1231 Encounter for screening mammogram for malignant neoplasm of breast: Secondary | ICD-10-CM

## 2017-03-03 IMAGING — CR DG CHEST 2V
2 series · 2 of 2 positions shown · non-contrast
Comparison: 08/07/2012.

CLINICAL DATA: Left lower chest pain with swelling for 3 weeks. No
known injury. History of hypertension and pre diabetes.

EXAM:
CHEST  2 VIEW

[w chest pa]
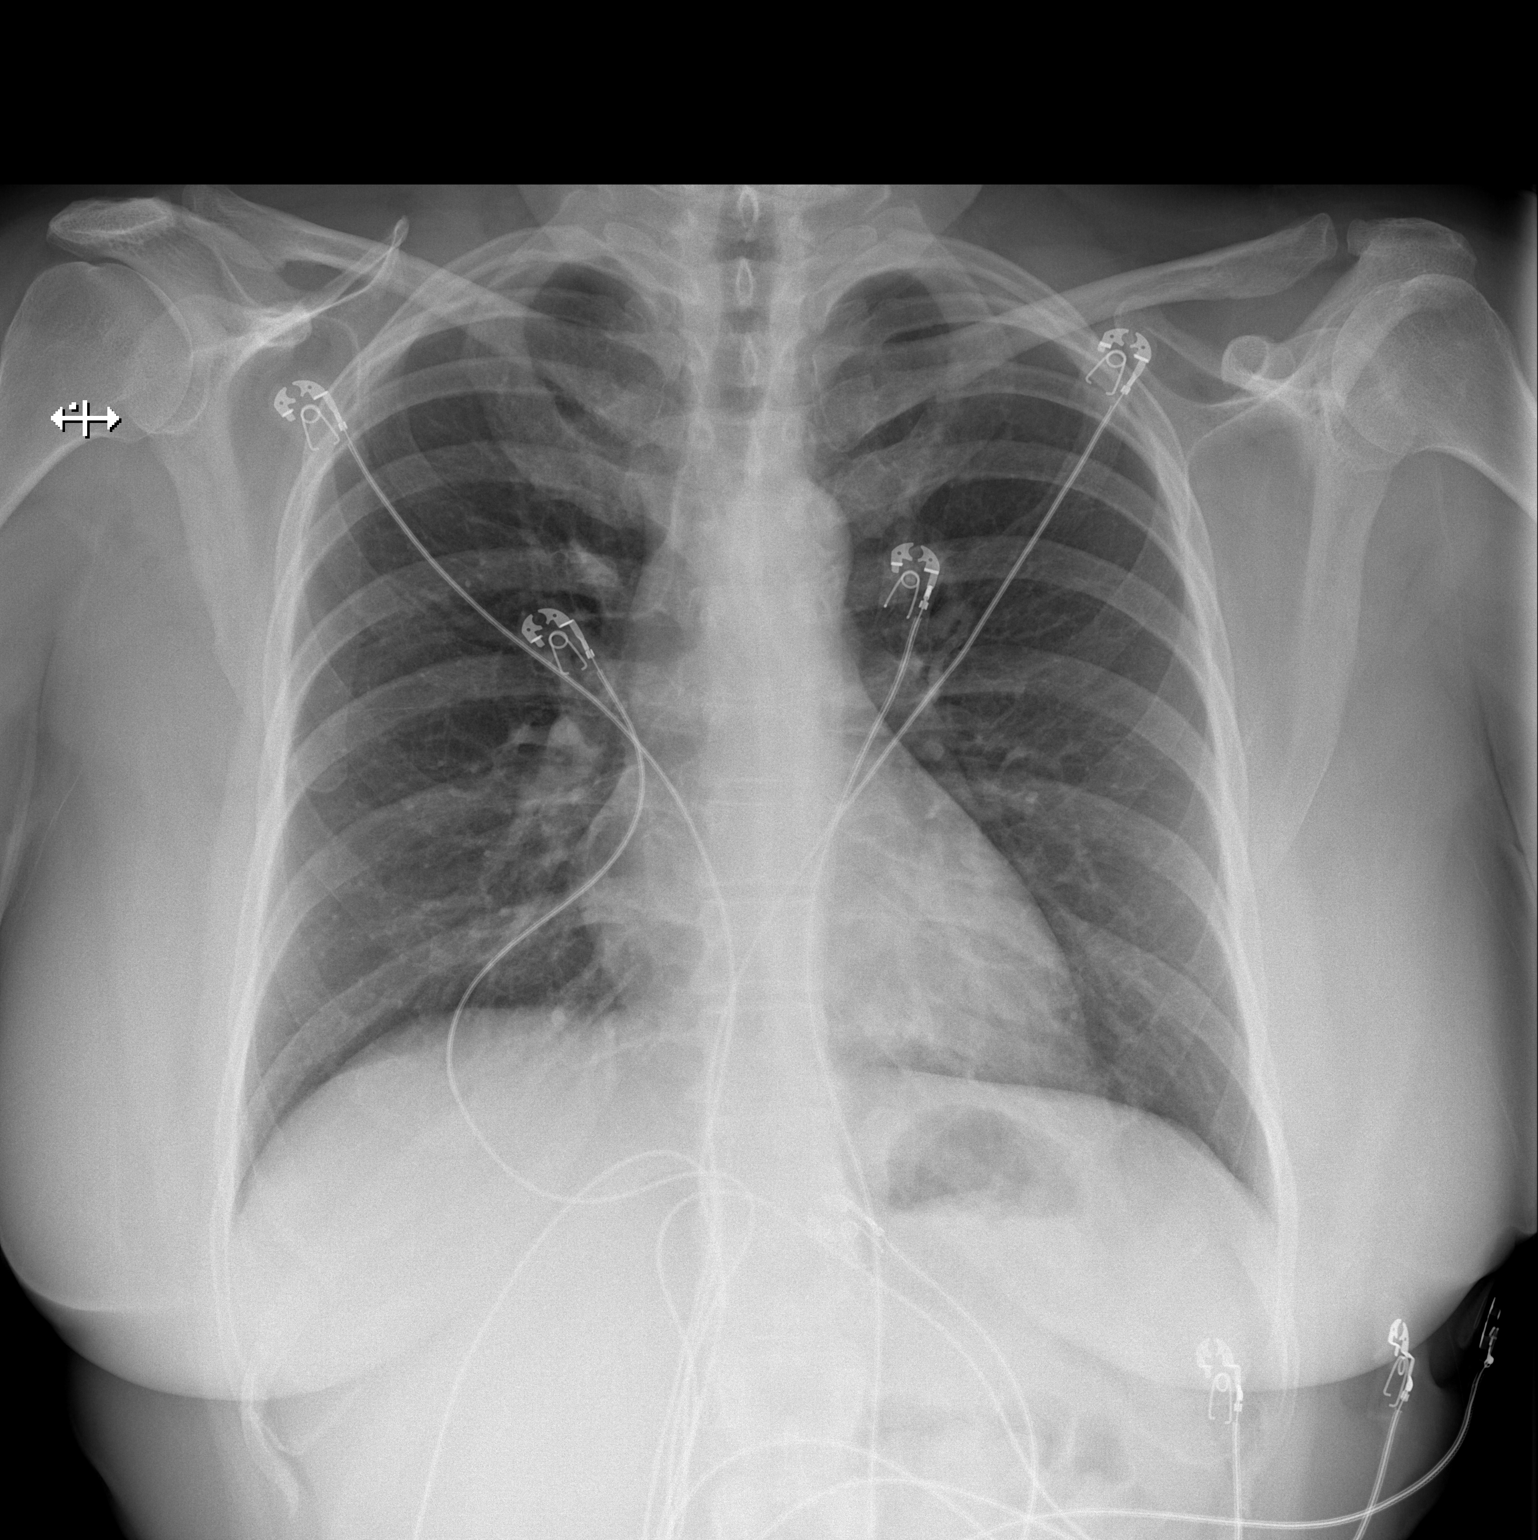

[w chest lat]
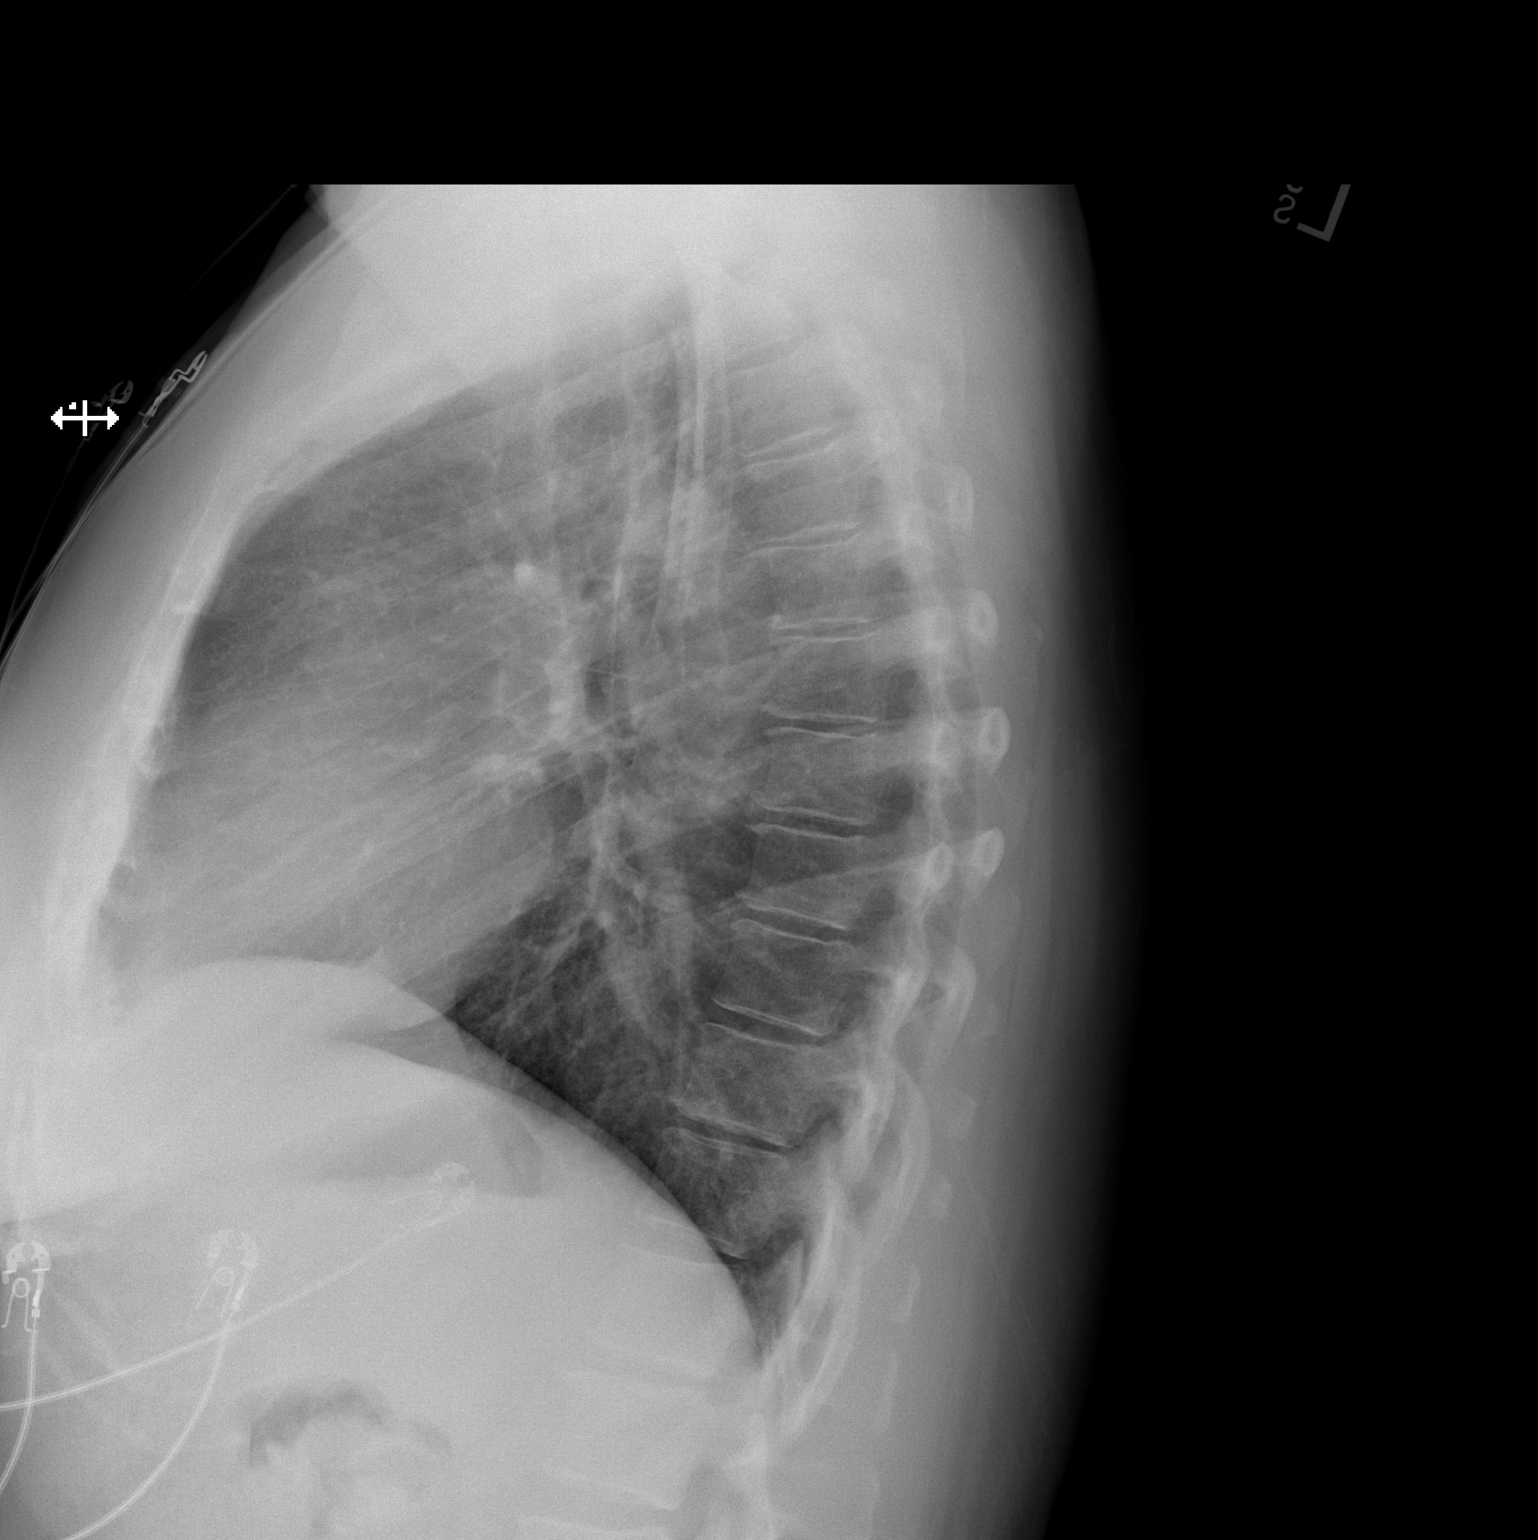

[2 of 2 positions shown; findings below may reference images not displayed]

FINDINGS: The heart size and mediastinal contours are normal. The lungs are
clear. There is no pleural effusion or pneumothorax. No acute
osseous findings are identified.
IMPRESSION: Stable chest.  No active cardiopulmonary process.

## 2018-04-22 ENCOUNTER — Other Ambulatory Visit: Payer: Self-pay | Admitting: Internal Medicine

## 2018-04-22 DIAGNOSIS — Z1231 Encounter for screening mammogram for malignant neoplasm of breast: Secondary | ICD-10-CM

## 2018-05-21 ENCOUNTER — Ambulatory Visit: Payer: Managed Care, Other (non HMO)

## 2018-06-23 ENCOUNTER — Ambulatory Visit
Admission: RE | Admit: 2018-06-23 | Discharge: 2018-06-23 | Disposition: A | Discharge status: Home / Self Care | Payer: 59 | Source: Ambulatory Visit | Attending: Internal Medicine | Admitting: Internal Medicine

## 2018-06-23 DIAGNOSIS — Z1231 Encounter for screening mammogram for malignant neoplasm of breast: Secondary | ICD-10-CM

## 2018-06-25 ENCOUNTER — Other Ambulatory Visit: Payer: Self-pay | Admitting: Internal Medicine

## 2018-06-25 DIAGNOSIS — R928 Other abnormal and inconclusive findings on diagnostic imaging of breast: Secondary | ICD-10-CM

## 2018-06-30 ENCOUNTER — Ambulatory Visit
Admission: RE | Admit: 2018-06-30 | Discharge: 2018-06-30 | Disposition: A | Discharge status: Home / Self Care | Payer: 59 | Source: Ambulatory Visit | Attending: Internal Medicine | Admitting: Internal Medicine

## 2018-06-30 DIAGNOSIS — R928 Other abnormal and inconclusive findings on diagnostic imaging of breast: Secondary | ICD-10-CM

## 2019-01-14 ENCOUNTER — Other Ambulatory Visit (HOSPITAL_COMMUNITY)
Admission: RE | Admit: 2019-01-14 | Discharge: 2019-01-14 | Disposition: A | Discharge status: Home / Self Care | Payer: 59 | Source: Ambulatory Visit | Attending: Obstetrics and Gynecology | Admitting: Obstetrics and Gynecology

## 2019-01-14 ENCOUNTER — Other Ambulatory Visit: Payer: Self-pay | Admitting: Obstetrics and Gynecology

## 2019-01-14 DIAGNOSIS — Z124 Encounter for screening for malignant neoplasm of cervix: Secondary | ICD-10-CM | POA: Diagnosis not present

## 2019-01-16 LAB — CYTOLOGY - PAP
Diagnosis: NEGATIVE
HPV: NOT DETECTED

## 2020-01-12 IMAGING — MG DIGITAL DIAGNOSTIC UNILATERAL RIGHT MAMMOGRAM WITH TOMO AND CAD
3 series · 3 of 7 positions shown · non-contrast
Comparison: Previous exam(s).

CLINICAL DATA: Screening recall for possible right breast masses.

EXAM:
DIGITAL DIAGNOSTIC RIGHT MAMMOGRAM WITH CAD AND TOMO
ULTRASOUND RIGHT BREAST

[R MLO synth-2D]
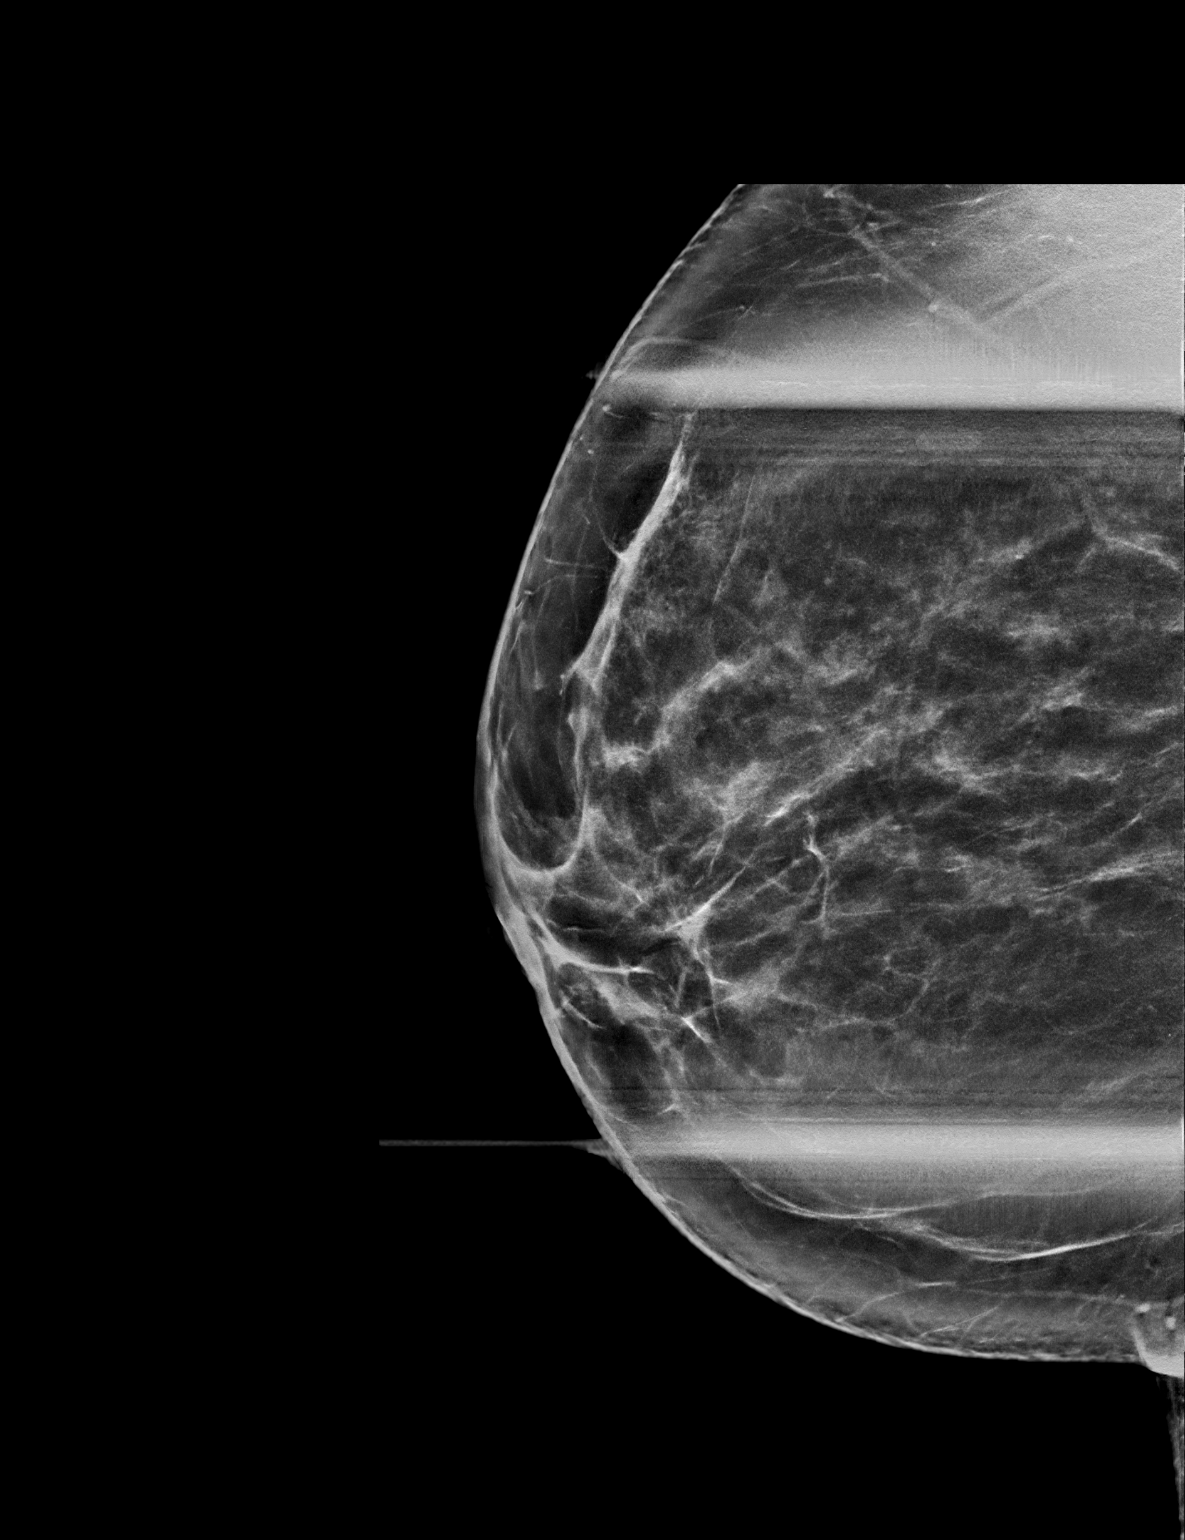

[R CC synth-2D]
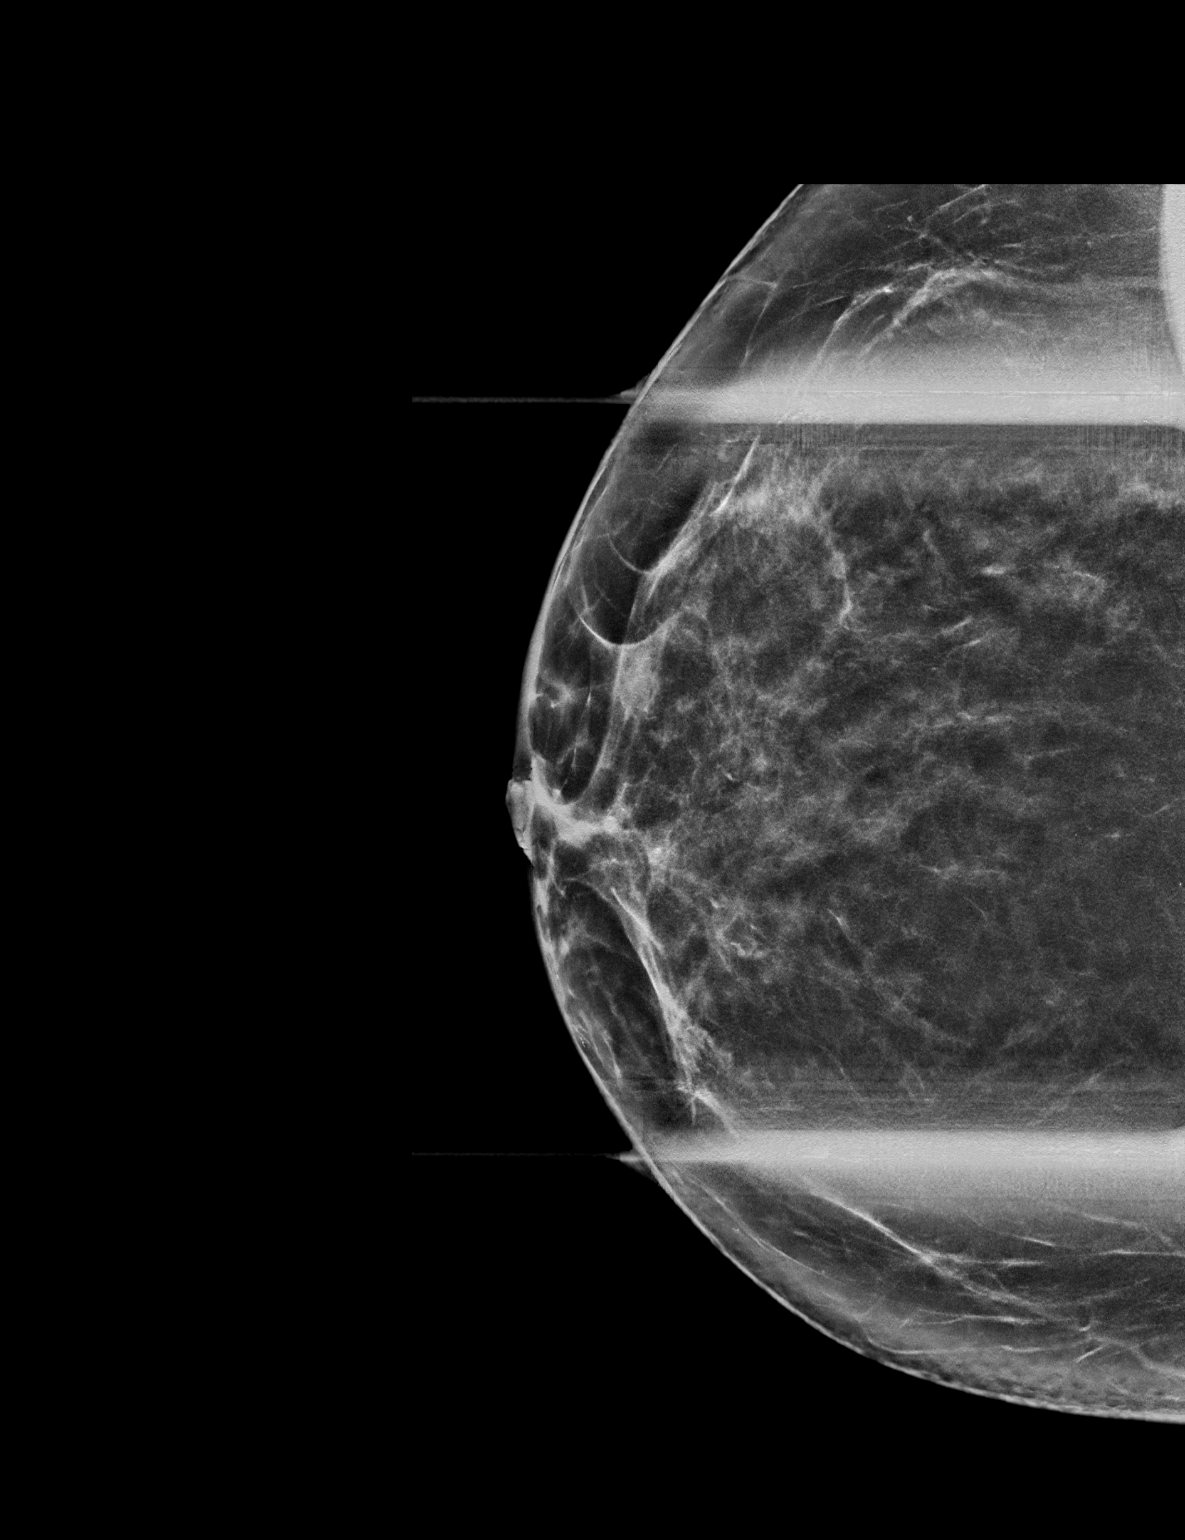

[R CC tomo · tomo slice 36/71.0]
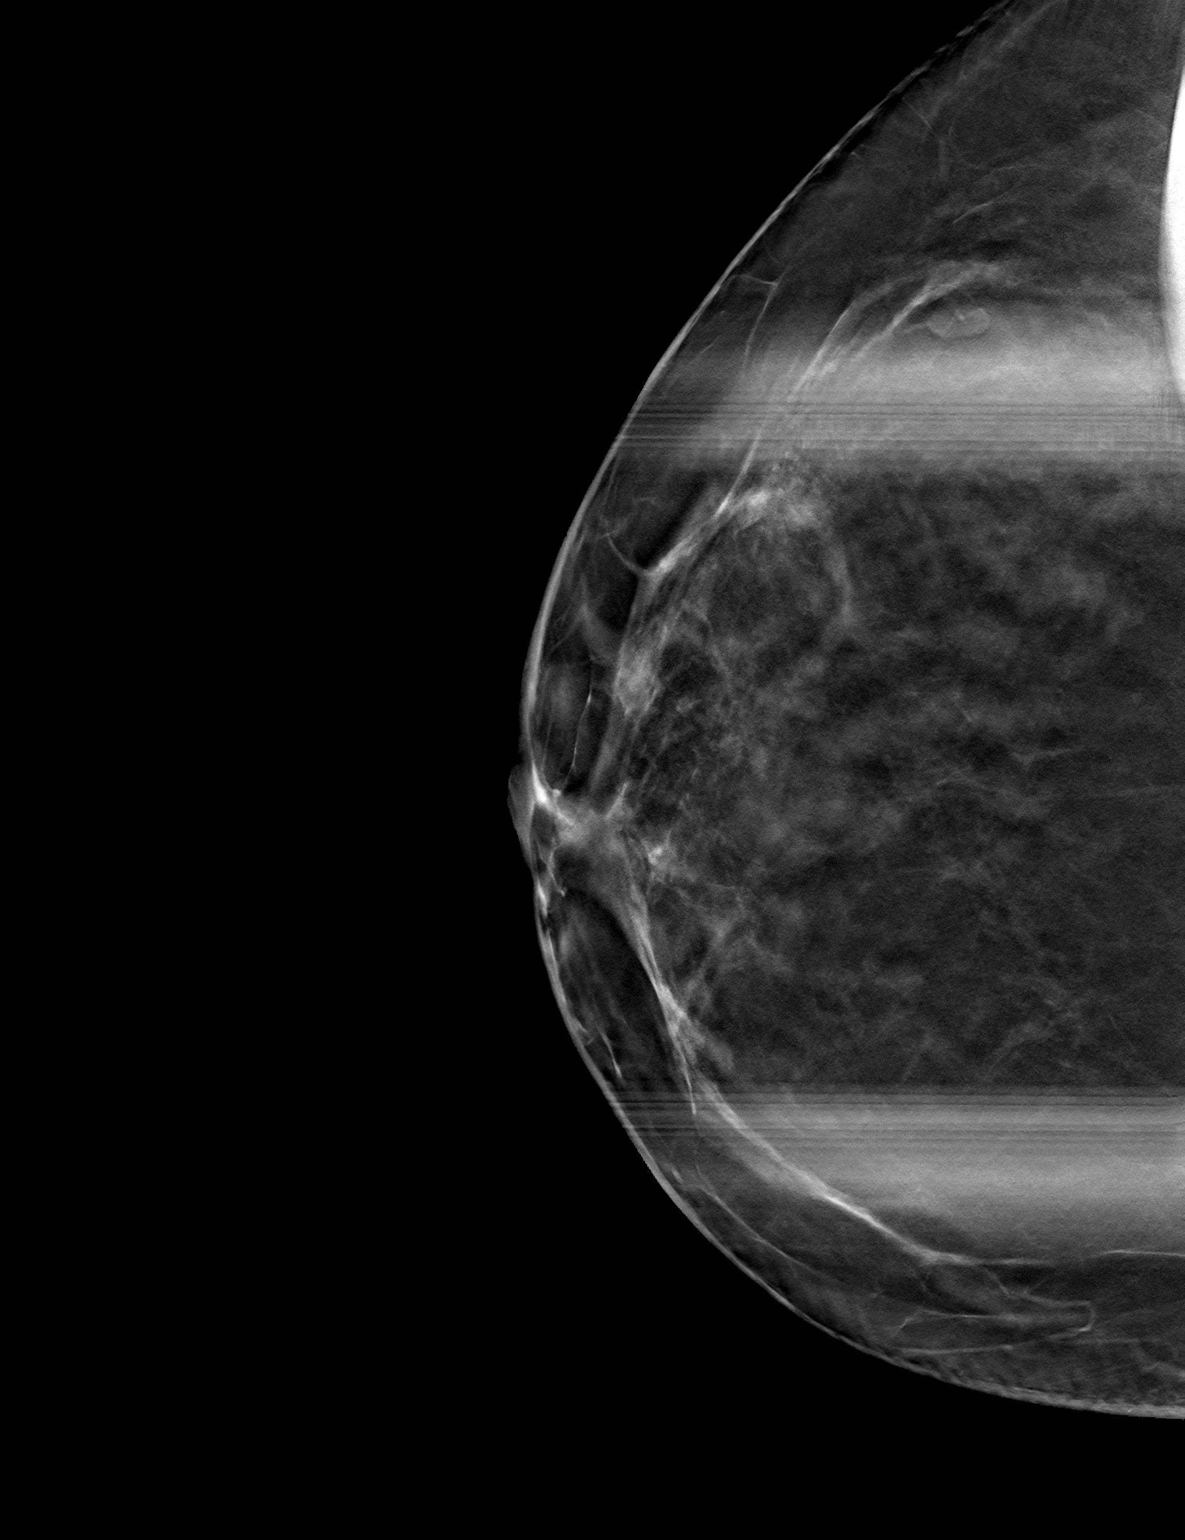

[3 of 7 positions shown; findings below may reference images not displayed]

ACR Breast Density Category c: The breast tissue is heterogeneously
dense, which may obscure small masses.
FINDINGS: Spot compression tomosynthesis images through the central
retroareolar right breast demonstrates 2 persistent low-density
masses in the central retroareolar to slightly inferior right
breast.

Mammographic images were processed with CAD.

Ultrasound of the right breast demonstrates multiple benign dilated
ducts and mild fibrocystic change. This corresponds well with the
mammographic findings.
IMPRESSION: 1. There are dilated ducts and fibrocystic change in the
retroareolar right breast. No mammographic or targeted sonographic
evidence of malignancy in the right breast.

RECOMMENDATION:
Screening mammogram in one year.(Code:UK-J-MIR)

I have discussed the findings and recommendations with the patient.
Results were also provided in writing at the conclusion of the
visit. If applicable, a reminder letter will be sent to the patient
regarding the next appointment.

BI-RADS CATEGORY  2: Benign.

## 2020-05-01 ENCOUNTER — Other Ambulatory Visit: Payer: Self-pay | Admitting: Family Medicine

## 2020-05-01 ENCOUNTER — Other Ambulatory Visit: Payer: Self-pay | Admitting: Internal Medicine

## 2020-05-01 ENCOUNTER — Ambulatory Visit
Admission: RE | Admit: 2020-05-01 | Discharge: 2020-05-01 | Disposition: A | Discharge status: Home / Self Care | Payer: 59 | Source: Ambulatory Visit | Attending: Internal Medicine | Admitting: Internal Medicine

## 2020-05-01 ENCOUNTER — Other Ambulatory Visit: Payer: Self-pay

## 2020-05-01 DIAGNOSIS — M79662 Pain in left lower leg: Secondary | ICD-10-CM

## 2021-01-03 ENCOUNTER — Other Ambulatory Visit: Payer: Self-pay | Admitting: Internal Medicine

## 2021-01-03 DIAGNOSIS — Z1231 Encounter for screening mammogram for malignant neoplasm of breast: Secondary | ICD-10-CM

## 2021-02-12 ENCOUNTER — Other Ambulatory Visit: Payer: Self-pay

## 2021-02-12 ENCOUNTER — Ambulatory Visit
Admission: RE | Admit: 2021-02-12 | Discharge: 2021-02-12 | Disposition: A | Discharge status: Home / Self Care | Payer: 59 | Source: Ambulatory Visit | Attending: Internal Medicine | Admitting: Internal Medicine

## 2021-02-12 DIAGNOSIS — Z1231 Encounter for screening mammogram for malignant neoplasm of breast: Secondary | ICD-10-CM

## 2021-07-30 ENCOUNTER — Ambulatory Visit: Payer: Self-pay | Admitting: Student

## 2021-07-30 NOTE — H&P (Signed)
TOTAL HIP ADMISSION H&P ? ?Patient is admitted for left total hip arthroplasty. ? ?Subjective: ? ?Chief Complaint: left hip pain ? ?HPI: Kelsey Cuevas, 46 y.o. female, has a history of pain and functional disability in the left hip(s) due to arthritis and patient has failed non-surgical conservative treatments for greater than 12 weeks to include NSAID's and/or analgesics, corticosteriod injections, flexibility and strengthening excercises, supervised PT with diminished ADL's post treatment, and activity modification.  Onset of symptoms was gradual starting 10 years ago with rapidlly worsening course since that time.The patient noted no past surgery on the right hip(s).  Patient currently rates pain in the right hip at 10 out of 10 with activity. Patient has night pain, worsening of pain with activity and weight bearing, pain that interfers with activities of daily living, and pain with passive range of motion. Patient has evidence of joint space narrowing by imaging studies. This condition presents safety issues increasing the risk of falls. There is no current active infection. ? ?Patient Active Problem List  ? Diagnosis Date Noted  ? 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 HYPERTENSION 03/22/2008  ? ELEVATED BLOOD PRESSURE WITHOUT DIAGNOSIS OF HYPERTENSION 02/16/2008  ? ANXIETY 01/01/2007  ? DEPRESSION 10/13/2006  ? ?Past Medical History:  ?Diagnosis Date  ? Diabetes mellitus without complication ([place])   ? states she was pre diabetic  ? Hyperlipidemia   ? Hypertension   ?  ?No past surgical history on file.  ?Current Outpatient Medications  ?Medication Sig Dispense Refill Last Dose  ? atorvastatin (LIPITOR) 20 MG tablet Take 20 mg by mouth daily.  3   ? Glycerin, PF, (OASIS TEARS PF) 0.25 % SOLN Place 1 drop into both eyes in the morning, at noon, and at bedtime.     ? losartan (COZAAR) 25 MG tablet Take 25 mg by mouth daily.  3   ? Multiple Vitamins-Minerals (MULTIVITAMIN & MINERAL PO) Take 1 tablet by mouth  daily.     ? ?No current facility-administered medications for this visit.  ? ?Allergies  ?Allergen Reactions  ? Penicillins Anaphylaxis  ?  Throat swelling  ? Codeine Sulfate   ?  Hallucinations   ? Latex   ?  Causes skin irritation of left on for too long   ?  ?Social History  ? ?Tobacco Use  ? Smoking status: Every Day  ? Smokeless tobacco: Never  ?Substance Use Topics  ? Alcohol use: Yes  ?  Comment: rare  ?  ?Family History  ?Problem Relation Age of Onset  ? COPD Other   ? Hypertension Other   ? GI Disease Other   ? Breast cancer Maternal Aunt   ?  ? ?Review of Systems  ?Musculoskeletal:  Positive for arthralgias, gait problem and myalgias.  ?All other systems reviewed and are negative. ? ?Objective: ? ?Physical Exam ?Constitutional:   ?   Appearance: Normal appearance.  ?HENT:  ?   Head: Normocephalic and atraumatic.  ?   Nose: Nose normal.  ?   Mouth/Throat:  ?   Mouth: Mucous membranes are moist.  ?   Pharynx: Oropharynx is clear.  ?Eyes:  ?   Extraocular Movements: Extraocular movements intact.  ?   Conjunctiva/sclera: Conjunctivae normal.  ?   Pupils: Pupils are equal, round, and reactive to light.  ?Cardiovascular:  ?   Rate and Rhythm: Normal rate and regular rhythm.  ?   Pulses: Normal pulses.  ?   Heart sounds: Normal heart sounds.  ?Pulmonary:  ?  Effort: Pulmonary effort is normal.  ?   Breath sounds: Normal breath sounds.  ?Abdominal:  ?   General: Abdomen is flat.  ?   Palpations: Abdomen is soft.  ?Genitourinary: ?   Comments: Deferred. ?Musculoskeletal:  ?   Cervical back: Normal range of motion and neck supple.  ?   Right hip: Normal.  ?   Left hip: Tenderness and bony tenderness present. Decreased range of motion.  ?Skin: ?   General: Skin is warm and dry.  ?   Capillary Refill: Capillary refill takes less than 2 seconds.  ?Neurological:  ?   General: No focal deficit present.  ?   Mental Status: She is alert and oriented to person, place, and time.  ?Psychiatric:     ?   Mood and Affect: Mood  normal.     ?   Behavior: Behavior normal.     ?   Thought Content: Thought content normal.     ?   Judgment: Judgment normal.  ? ? ?Vital signs in last 24 hours: ?@VSRANGES @ ? ?Labs: ? ? ?Estimated body mass index is 35.69 kg/m? as calculated from the following: ?  Height as of 10/30/15: 5\' 6"  (1.676 m). ?  Weight as of 10/30/15: 100.3 kg. ? ? ?Imaging Review ?Plain radiographs demonstrate severe degenerative joint disease of the right hip(s). The bone quality appears to be adequate for age and reported activity level. ? ? ? ? ? ?Assessment/Plan: ? ?End stage arthritis, right hip(s) ? ?The patient history, physical examination, clinical judgement of the provider and imaging studies are consistent with end stage degenerative joint disease of the right hip(s) and total hip arthroplasty is deemed medically necessary. The treatment options including medical management, injection therapy, arthroscopy and arthroplasty were discussed at length. The risks and benefits of total hip arthroplasty were presented and reviewed. The risks due to aseptic loosening, infection, stiffness, dislocation/subluxation,  thromboembolic complications and other imponderables were discussed.  The patient acknowledged the explanation, agreed to proceed with the plan and consent was signed. Patient is being admitted for inpatient treatment for surgery, pain control, PT, OT, prophylactic antibiotics, VTE prophylaxis, progressive ambulation and ADL's and discharge planning.The patient is planning to be discharged home with HEP including hip abductor strengthening with standing side leg raises and stationary bike/elliptical.  ? ? ? ?Patient's anticipated LOS is less than 2 midnights, meeting these requirements: ?- Younger than 73 ?- Lives within 1 hour of care ?- Has a competent adult at home to recover with post-op recover ?- NO history of ? - Chronic pain requiring opiods ? - Diabetes ? - Coronary Artery Disease ? - Heart failure ? - Heart  attack ? - Stroke ? - DVT/VTE ? - Cardiac arrhythmia ? - Respiratory Failure/COPD ? - Renal failure ? - Anemia ? - Advanced Liver disease ? ? ?

## 2021-07-30 NOTE — Progress Notes (Addendum)
Anesthesia Review: ? ?PCP: DR Donette Larry - clearance dated 07/25/21 on chart LOV 07/25/21 on chart  ?Cardiologist : ?Chest x-ray : ?EKG : 07/25/21 on chart  ?Echo : ?Stress test: ?Cardiac Cath :  ?Activity level: can do ao flgiht of stairs without difficulty  ?Sleep Study/ CPAP : none  ?Fasting Blood Sugar :      / Checks Blood Sugar -- times a day:   ?Blood Thinner/ Instructions /Last Dose: ?ASA / Instructions/ Last Dose :   ?rEQUESTED ORDERS ON 07/31/21 AND 08/02/2021.   ?Prediabetes- does not check glucose at home  ?Hgba1c-08/02/21- 6.0  ?

## 2021-07-30 NOTE — H&P (View-Only) (Signed)
TOTAL HIP ADMISSION H&P ? ?Patient is admitted for left total hip arthroplasty. ? ?Subjective: ? ?Chief Complaint: left hip pain ? ?HPI: Kelsey Cuevas, 46 y.o. female, has a history of pain and functional disability in the left hip(s) due to arthritis and patient has failed non-surgical conservative treatments for greater than 12 weeks to include NSAID's and/or analgesics, corticosteriod injections, flexibility and strengthening excercises, supervised PT with diminished ADL's post treatment, and activity modification.  Onset of symptoms was gradual starting 10 years ago with rapidlly worsening course since that time.The patient noted no past surgery on the right hip(s).  Patient currently rates pain in the right hip at 10 out of 10 with activity. Patient has night pain, worsening of pain with activity and weight bearing, pain that interfers with activities of daily living, and pain with passive range of motion. Patient has evidence of joint space narrowing by imaging studies. This condition presents safety issues increasing the risk of falls. There is no current active infection. ? ?Patient Active Problem List  ? Diagnosis Date Noted  ? 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 HYPERTENSION 03/22/2008  ? ELEVATED BLOOD PRESSURE WITHOUT DIAGNOSIS OF HYPERTENSION 02/16/2008  ? ANXIETY 01/01/2007  ? DEPRESSION 10/13/2006  ? ?Past Medical History:  ?Diagnosis Date  ? Diabetes mellitus without complication ([place])   ? states she was pre diabetic  ? Hyperlipidemia   ? Hypertension   ?  ?No past surgical history on file.  ?Current Outpatient Medications  ?Medication Sig Dispense Refill Last Dose  ? atorvastatin (LIPITOR) 20 MG tablet Take 20 mg by mouth daily.  3   ? Glycerin, PF, (OASIS TEARS PF) 0.25 % SOLN Place 1 drop into both eyes in the morning, at noon, and at bedtime.     ? losartan (COZAAR) 25 MG tablet Take 25 mg by mouth daily.  3   ? Multiple Vitamins-Minerals (MULTIVITAMIN & MINERAL PO) Take 1 tablet by mouth  daily.     ? ?No current facility-administered medications for this visit.  ? ?Allergies  ?Allergen Reactions  ? Penicillins Anaphylaxis  ?  Throat swelling  ? Codeine Sulfate   ?  Hallucinations   ? Latex   ?  Causes skin irritation of left on for too long   ?  ?Social History  ? ?Tobacco Use  ? Smoking status: Every Day  ? Smokeless tobacco: Never  ?Substance Use Topics  ? Alcohol use: Yes  ?  Comment: rare  ?  ?Family History  ?Problem Relation Age of Onset  ? COPD Other   ? Hypertension Other   ? GI Disease Other   ? Breast cancer Maternal Aunt   ?  ? ?Review of Systems  ?Musculoskeletal:  Positive for arthralgias, gait problem and myalgias.  ?All other systems reviewed and are negative. ? ?Objective: ? ?Physical Exam ?Constitutional:   ?   Appearance: Normal appearance.  ?HENT:  ?   Head: Normocephalic and atraumatic.  ?   Nose: Nose normal.  ?   Mouth/Throat:  ?   Mouth: Mucous membranes are moist.  ?   Pharynx: Oropharynx is clear.  ?Eyes:  ?   Extraocular Movements: Extraocular movements intact.  ?   Conjunctiva/sclera: Conjunctivae normal.  ?   Pupils: Pupils are equal, round, and reactive to light.  ?Cardiovascular:  ?   Rate and Rhythm: Normal rate and regular rhythm.  ?   Pulses: Normal pulses.  ?   Heart sounds: Normal heart sounds.  ?Pulmonary:  ?  Effort: Pulmonary effort is normal.  ?   Breath sounds: Normal breath sounds.  ?Abdominal:  ?   General: Abdomen is flat.  ?   Palpations: Abdomen is soft.  ?Genitourinary: ?   Comments: Deferred. ?Musculoskeletal:  ?   Cervical back: Normal range of motion and neck supple.  ?   Right hip: Normal.  ?   Left hip: Tenderness and bony tenderness present. Decreased range of motion.  ?Skin: ?   General: Skin is warm and dry.  ?   Capillary Refill: Capillary refill takes less than 2 seconds.  ?Neurological:  ?   General: No focal deficit present.  ?   Mental Status: She is alert and oriented to person, place, and time.  ?Psychiatric:     ?   Mood and Affect: Mood  normal.     ?   Behavior: Behavior normal.     ?   Thought Content: Thought content normal.     ?   Judgment: Judgment normal.  ? ? ?Vital signs in last 24 hours: ?@VSRANGES @ ? ?Labs: ? ? ?Estimated body mass index is 35.69 kg/m? as calculated from the following: ?  Height as of 10/30/15: 5\' 6"  (1.676 m). ?  Weight as of 10/30/15: 100.3 kg. ? ? ?Imaging Review ?Plain radiographs demonstrate severe degenerative joint disease of the right hip(s). The bone quality appears to be adequate for age and reported activity level. ? ? ? ? ? ?Assessment/Plan: ? ?End stage arthritis, right hip(s) ? ?The patient history, physical examination, clinical judgement of the provider and imaging studies are consistent with end stage degenerative joint disease of the right hip(s) and total hip arthroplasty is deemed medically necessary. The treatment options including medical management, injection therapy, arthroscopy and arthroplasty were discussed at length. The risks and benefits of total hip arthroplasty were presented and reviewed. The risks due to aseptic loosening, infection, stiffness, dislocation/subluxation,  thromboembolic complications and other imponderables were discussed.  The patient acknowledged the explanation, agreed to proceed with the plan and consent was signed. Patient is being admitted for inpatient treatment for surgery, pain control, PT, OT, prophylactic antibiotics, VTE prophylaxis, progressive ambulation and ADL's and discharge planning.The patient is planning to be discharged home with HEP including hip abductor strengthening with standing side leg raises and stationary bike/elliptical.  ? ? ? ?Patient's anticipated LOS is less than 2 midnights, meeting these requirements: ?- Younger than 37 ?- Lives within 1 hour of care ?- Has a competent adult at home to recover with post-op recover ?- NO history of ? - Chronic pain requiring opiods ? - Diabetes ? - Coronary Artery Disease ? - Heart failure ? - Heart  attack ? - Stroke ? - DVT/VTE ? - Cardiac arrhythmia ? - Respiratory Failure/COPD ? - Renal failure ? - Anemia ? - Advanced Liver disease ? ? ?

## 2021-07-31 NOTE — Progress Notes (Signed)
Need orders in epic  Preop on 08/02/21.  ?

## 2021-08-02 ENCOUNTER — Other Ambulatory Visit: Payer: Self-pay

## 2021-08-02 ENCOUNTER — Encounter (HOSPITAL_COMMUNITY): Payer: Self-pay

## 2021-08-02 ENCOUNTER — Encounter (HOSPITAL_COMMUNITY)
Admission: RE | Admit: 2021-08-02 | Discharge: 2021-08-02 | Disposition: A | Discharge status: Home / Self Care | Payer: 59 | Source: Ambulatory Visit | Attending: Orthopedic Surgery | Admitting: Orthopedic Surgery

## 2021-08-02 VITALS — BP 153/104 | HR 77 | Temp 98.9°F | Resp 16 | Ht 66.0 in | Wt 230.0 lb

## 2021-08-02 DIAGNOSIS — Z01818 Encounter for other preprocedural examination: Secondary | ICD-10-CM | POA: Diagnosis not present

## 2021-08-02 HISTORY — DX: Unspecified osteoarthritis, unspecified site: M19.90

## 2021-08-02 HISTORY — DX: Prediabetes: R73.03

## 2021-08-02 LAB — CBC
HCT: 46 % (ref 36.0–46.0)
Hemoglobin: 15.2 g/dL — ABNORMAL HIGH (ref 12.0–15.0)
MCH: 29.7 pg (ref 26.0–34.0)
MCHC: 33 g/dL (ref 30.0–36.0)
MCV: 90 fL (ref 80.0–100.0)
Platelets: 399 10*3/uL (ref 150–400)
RBC: 5.11 MIL/uL (ref 3.87–5.11)
RDW: 14 % (ref 11.5–15.5)
WBC: 11.1 10*3/uL — ABNORMAL HIGH (ref 4.0–10.5)
nRBC: 0 % (ref 0.0–0.2)

## 2021-08-02 LAB — HEMOGLOBIN A1C
Hgb A1c MFr Bld: 6 % — ABNORMAL HIGH (ref 4.8–5.6)
Mean Plasma Glucose: 125.5 mg/dL

## 2021-08-02 LAB — BASIC METABOLIC PANEL
Anion gap: 8 (ref 5–15)
BUN: 12 mg/dL (ref 6–20)
CO2: 23 mmol/L (ref 22–32)
Calcium: 9.3 mg/dL (ref 8.9–10.3)
Chloride: 105 mmol/L (ref 98–111)
Creatinine, Ser: 0.71 mg/dL (ref 0.44–1.00)
GFR, Estimated: >60 mL/min (ref >60)
Glucose, Bld: 102 mg/dL — ABNORMAL HIGH (ref 70–99)
Potassium: 4 mmol/L (ref 3.5–5.1)
Sodium: 136 mmol/L (ref 135–145)

## 2021-08-02 LAB — GLUCOSE, CAPILLARY: Glucose-Capillary: 109 mg/dL — ABNORMAL HIGH (ref 70–99)

## 2021-08-02 LAB — SURGICAL PCR SCREEN
MRSA, PCR: NEGATIVE
Staphylococcus aureus: NEGATIVE

## 2021-08-02 NOTE — Progress Notes (Signed)
DUE TO COVID-19 ONLY 2VISITOR IS ALLOWED TO COME WITH YOU AND STAY IN THE WAITING ROOM ONLY DURING PRE OP AND PROCEDURE DAY OF SURGERY.  4 VISITOR  MAY VISIT WITH YOU AFTER SURGERY IN YOUR PRIVATE ROOM DURING VISITING HOURS ONLY! ?YOU MAY HAVE ONE PERSON SPEND THE NITE WITH YOU IN YOUR ROOM AFTER SURGERY.   ? ? ? Your procedure is scheduled on:  ?        08/09/2021  ? Report to [facility] Main  Entrance ? ? Report to admitting at         0745         AM ?DO NOT BRING INSURANCE CARD, PICTURE ID OR WALLET DAY OF SURGERY.  ?  ? ? Call this number if you have problems the morning of surgery [phone]  ? ? REMEMBER: NO  SOLID FOODS , CANDY, GUM OR MINTS AFTER MIDNITE THE NITE BEFORE SURGERY .       [facility] [employer] CLEAR LIQUIDS UNTIL       0730AM           DAY OF SURGERY.      ? ? ? ? ?CLEAR LIQUID DIET ? ? ?Foods Allowed      ?WATER ?BLACK COFFEE ( SUGAR OK, NO MILK, CREAM OR CREAMER) REGULAR AND DECAF  ?TEA ( SUGAR OK NO MILK, CREAM, OR CREAMER) REGULAR AND DECAF  ?PLAIN JELLO ( NO RED)  ?FRUIT ICES ( NO RED, NO FRUIT PULP)  ?POPSICLES ( NO RED)  ?JUICE- APPLE, WHITE GRAPE AND WHITE CRANBERRY  ?SPORT DRINK LIKE GATORADE ( NO RED)  ?CLEAR BROTH ( VEGETABLE , CHICKEN OR BEEF)                                                               ? ?    ? ?BRUSH YOUR TEETH MORNING OF SURGERY AND RINSE YOUR MOUTH OUT, NO CHEWING GUM CANDY OR MINTS. ?  ? ? Take these medicines the morning of surgery with A SIP OF WATER:  NONE  ? ? ?DO NOT TAKE ANY DIABETIC MEDICATIONS DAY OF YOUR SURGERY ?                  ?            You may not have any metal on your body including hair pins and  ?            piercings  Do not wear jewelry, make-up, lotions, powders or perfumes, deodorant ?            Do not wear nail polish on your fingernails.   ?           IF YOU ARE A FEMALE AND WANT TO SHAVE UNDER ARMS OR LEGS PRIOR TO SURGERY YOU MUST DO SO AT LEAST 48 HOURS PRIOR TO SURGERY.  ?            Men may shave face and neck. ? ? Do not bring  valuables to the hospital. [facility] IS NOT ?            RESPONSIBLE   FOR VALUABLES. ? Contacts, dentures or bridgework may not be worn into surgery. ? Leave suitcase in the car. After surgery it may  be brought to your room. ? ?  ? Patients discharged the day of surgery will not be allowed to drive home. IF YOU ARE HAVING SURGERY AND GOING HOME THE SAME DAY, YOU MUST HAVE AN ADULT TO DRIVE YOU HOME AND BE WITH YOU FOR 24 HOURS. YOU MAY GO HOME BY TAXI OR UBER OR ORTHERWISE, BUT AN ADULT MUST ACCOMPANY YOU HOME AND STAY WITH YOU FOR 24 HOURS. ?  ? ?            Please read over the following fact sheets you were given: ?_____________________________________________________________________ ? ?[facility] - Preparing for Surgery ?Before surgery, you can play an important role.  Because skin is not sterile, your skin needs to be as free of germs as possible.  You can reduce the number of germs on your skin by washing with CHG (chlorahexidine gluconate) soap before surgery.  CHG is an antiseptic cleaner which kills germs and bonds with the skin to continue killing germs even after washing. ?Please DO NOT use if you have an allergy to CHG or antibacterial soaps.  If your skin becomes reddened/irritated stop using the CHG and inform your nurse when you arrive at Short Stay. ?Do not shave (including legs and underarms) for at least 48 hours prior to the first CHG shower.  You may shave your face/neck. ?Please follow these instructions carefully: ? 1.  Shower with CHG Soap the night before surgery and the  morning of Surgery. ? 2.  If you choose to wash your hair, wash your hair first as usual with your  normal  shampoo. ? 3.  After you shampoo, rinse your hair and body thoroughly to remove the  shampoo.                           4.  Use CHG as you would any other liquid soap.  You can apply chg directly  to the skin and wash  ?                     Gently with a scrungie or clean washcloth. ? 5.  Apply the CHG Soap to your  body ONLY FROM THE NECK DOWN.   Do not use on face/ open      ?                     Wound or open sores. Avoid contact with eyes, ears mouth and genitals (private parts).  ?                     Engineering geologist,  Genitals (private parts) with your normal soap. ?            6.  Wash thoroughly, paying special attention to the area where your surgery  will be performed. ? 7.  Thoroughly rinse your body with warm water from the neck down. ? 8.  DO NOT shower/wash with your normal soap after using and rinsing off  the CHG Soap. ?               9.  Pat yourself dry with a clean towel. ?           10.  Wear clean pajamas. ?           11.  Place clean sheets on your bed the night of your first shower and do not  sleep with pets. ?Day  of Surgery : ?Do not apply any lotions/deodorants the morning of surgery.  Please wear clean clothes to the hospital/surgery center. ? ?FAILURE TO FOLLOW THESE INSTRUCTIONS MAY RESULT IN THE CANCELLATION OF YOUR SURGERY ?PATIENT SIGNATURE_________________________________ ? ?NURSE SIGNATURE__________________________________ ? ?________________________________________________________________________  ? ? ?           ?

## 2021-08-09 ENCOUNTER — Ambulatory Visit (HOSPITAL_COMMUNITY): Payer: 59

## 2021-08-09 ENCOUNTER — Ambulatory Visit (HOSPITAL_BASED_OUTPATIENT_CLINIC_OR_DEPARTMENT_OTHER): Payer: 59 | Admitting: Anesthesiology

## 2021-08-09 ENCOUNTER — Ambulatory Visit (HOSPITAL_COMMUNITY)
Admission: RE | Admit: 2021-08-09 | Discharge: 2021-08-09 | Disposition: A | Discharge status: Home / Self Care | Payer: 59 | Attending: Orthopedic Surgery | Admitting: Orthopedic Surgery

## 2021-08-09 ENCOUNTER — Other Ambulatory Visit (HOSPITAL_COMMUNITY): Payer: Self-pay | Admitting: Student

## 2021-08-09 ENCOUNTER — Encounter (HOSPITAL_COMMUNITY)
Admission: RE | Disposition: A | Discharge status: Home / Self Care | Payer: Self-pay | Source: Home / Self Care | Attending: Orthopedic Surgery

## 2021-08-09 ENCOUNTER — Ambulatory Visit (HOSPITAL_COMMUNITY): Payer: 59 | Admitting: Emergency Medicine

## 2021-08-09 ENCOUNTER — Other Ambulatory Visit: Payer: Self-pay

## 2021-08-09 DIAGNOSIS — M1612 Unilateral primary osteoarthritis, left hip: Secondary | ICD-10-CM | POA: Insufficient documentation

## 2021-08-09 DIAGNOSIS — I10 Essential (primary) hypertension: Secondary | ICD-10-CM | POA: Insufficient documentation

## 2021-08-09 DIAGNOSIS — F17210 Nicotine dependence, cigarettes, uncomplicated: Secondary | ICD-10-CM | POA: Insufficient documentation

## 2021-08-09 HISTORY — PX: TOTAL HIP ARTHROPLASTY: SHX124

## 2021-08-09 LAB — TYPE AND SCREEN
ABO/RH(D): A POS
Antibody Screen: NEGATIVE

## 2021-08-09 LAB — HCG, SERUM, QUALITATIVE: Preg, Serum: NEGATIVE

## 2021-08-09 LAB — ABO/RH: ABO/RH(D): A POS

## 2021-08-09 SURGERY — ARTHROPLASTY, HIP, TOTAL, ANTERIOR APPROACH
Anesthesia: Monitor Anesthesia Care | Site: Hip | Laterality: Left

## 2021-08-09 MED ORDER — SODIUM CHLORIDE 0.9 % IR SOLN
Status: DC | PRN
  Administered 2021-08-09: 1000 mL

## 2021-08-09 MED ORDER — LACTATED RINGERS IV BOLUS
500.0000 mL | Freq: Once | INTRAVENOUS | Status: AC
  Administered 2021-08-09: 500 mL via INTRAVENOUS

## 2021-08-09 MED ORDER — METHOCARBAMOL 500 MG PO TABS
500.0000 mg | ORAL_TABLET | Freq: Four times a day (QID) | ORAL | Status: DC | PRN
  Administered 2021-08-09: 500 mg via ORAL

## 2021-08-09 MED ORDER — DEXAMETHASONE SODIUM PHOSPHATE 10 MG/ML IJ SOLN
INTRAMUSCULAR | Status: AC
  Filled 2021-08-09: qty 1

## 2021-08-09 MED ORDER — FENTANYL CITRATE (PF) 100 MCG/2ML IJ SOLN
INTRAMUSCULAR | Status: DC | PRN
  Administered 2021-08-09: 100 ug via INTRAVENOUS
  Administered 2021-08-09: 50 ug via INTRAVENOUS

## 2021-08-09 MED ORDER — MEPERIDINE HCL 50 MG/ML IJ SOLN: 6.2500 mg | INTRAMUSCULAR | Status: DC | PRN

## 2021-08-09 MED ORDER — PHENYLEPHRINE HCL (PRESSORS) 10 MG/ML IV SOLN
INTRAVENOUS | Status: AC
  Filled 2021-08-09: qty 1

## 2021-08-09 MED ORDER — ACETAMINOPHEN 325 MG PO TABS: 325.0000 mg | ORAL_TABLET | ORAL | Status: DC | PRN

## 2021-08-09 MED ORDER — POVIDONE-IODINE 10 % EX SWAB: 2.0000 "application " | Freq: Once | CUTANEOUS | Status: DC

## 2021-08-09 MED ORDER — PROPOFOL 500 MG/50ML IV EMUL
INTRAVENOUS | Status: DC | PRN
  Administered 2021-08-09: 100 ug/kg/min via INTRAVENOUS

## 2021-08-09 MED ORDER — DOCUSATE SODIUM 100 MG PO CAPS: 100.0000 mg | ORAL_CAPSULE | Freq: Two times a day (BID) | ORAL | 1 refills | Status: AC

## 2021-08-09 MED ORDER — ISOPROPYL ALCOHOL 70 % SOLN
Status: DC | PRN
  Administered 2021-08-09: 1 via TOPICAL

## 2021-08-09 MED ORDER — LACTATED RINGERS IV SOLN: INTRAVENOUS | Status: DC

## 2021-08-09 MED ORDER — LACTATED RINGERS IV BOLUS
250.0000 mL | Freq: Once | INTRAVENOUS | Status: AC
  Administered 2021-08-09: 250 mL via INTRAVENOUS

## 2021-08-09 MED ORDER — ACETAMINOPHEN 500 MG PO TABS: 1000.0000 mg | ORAL_TABLET | Freq: Once | ORAL | Status: DC

## 2021-08-09 MED ORDER — FENTANYL CITRATE (PF) 100 MCG/2ML IJ SOLN
INTRAMUSCULAR | Status: AC
Start: 2021-08-09 — End: ?
  Filled 2021-08-09: qty 2

## 2021-08-09 MED ORDER — METHOCARBAMOL 500 MG PO TABS
ORAL_TABLET | ORAL | Status: AC
  Filled 2021-08-09: qty 1

## 2021-08-09 MED ORDER — SENNA 8.6 MG PO TABS: 2.0000 | ORAL_TABLET | Freq: Every day | ORAL | 1 refills | Status: AC

## 2021-08-09 MED ORDER — HYDROCODONE-ACETAMINOPHEN 5-325 MG PO TABS: 1.0000 | ORAL_TABLET | ORAL | 0 refills | Status: AC | PRN

## 2021-08-09 MED ORDER — SODIUM CHLORIDE (PF) 0.9 % IJ SOLN
INTRAMUSCULAR | Status: AC
  Filled 2021-08-09: qty 50

## 2021-08-09 MED ORDER — ONDANSETRON HCL 4 MG PO TABS: 4.0000 mg | ORAL_TABLET | Freq: Three times a day (TID) | ORAL | 0 refills | Status: AC | PRN

## 2021-08-09 MED ORDER — BUPIVACAINE-EPINEPHRINE 0.25% -1:200000 IJ SOLN
INTRAMUSCULAR | Status: DC | PRN
  Administered 2021-08-09: 30 mL

## 2021-08-09 MED ORDER — SODIUM CHLORIDE 0.9 % IV SOLN: INTRAVENOUS | Status: DC

## 2021-08-09 MED ORDER — DEXAMETHASONE SODIUM PHOSPHATE 10 MG/ML IJ SOLN
INTRAMUSCULAR | Status: DC | PRN
  Administered 2021-08-09: 10 mg via INTRAVENOUS

## 2021-08-09 MED ORDER — KETOROLAC TROMETHAMINE 30 MG/ML IJ SOLN
INTRAMUSCULAR | Status: AC
  Filled 2021-08-09: qty 1

## 2021-08-09 MED ORDER — KETOROLAC TROMETHAMINE 30 MG/ML IJ SOLN
INTRAMUSCULAR | Status: DC | PRN
Start: 2021-08-09 — End: 2021-08-10
  Administered 2021-08-09: 30 mg via INTRAVENOUS

## 2021-08-09 MED ORDER — CEFAZOLIN SODIUM-DEXTROSE 2-4 GM/100ML-% IV SOLN
2.0000 g | Freq: Once | INTRAVENOUS | Status: AC
  Administered 2021-08-09: 2 g via INTRAVENOUS
  Filled 2021-08-09: qty 100

## 2021-08-09 MED ORDER — BUPIVACAINE IN DEXTROSE 0.75-8.25 % IT SOLN
INTRATHECAL | Status: DC | PRN
  Administered 2021-08-09: 2 mL via INTRATHECAL

## 2021-08-09 MED ORDER — PROPOFOL 10 MG/ML IV BOLUS
INTRAVENOUS | Status: AC
  Filled 2021-08-09: qty 20

## 2021-08-09 MED ORDER — METHOCARBAMOL 500 MG IVPB - SIMPLE MED: 500.0000 mg | Freq: Four times a day (QID) | INTRAVENOUS | Status: DC | PRN

## 2021-08-09 MED ORDER — DIPHENHYDRAMINE HCL 50 MG/ML IJ SOLN
INTRAMUSCULAR | Status: DC | PRN
  Administered 2021-08-09: 12.5 mg via INTRAVENOUS

## 2021-08-09 MED ORDER — ORAL CARE MOUTH RINSE
15.0000 mL | Freq: Once | OROMUCOSAL | Status: AC
  Administered 2021-08-09: 15 mL via OROMUCOSAL

## 2021-08-09 MED ORDER — PROPOFOL 10 MG/ML IV BOLUS
INTRAVENOUS | Status: DC | PRN
  Administered 2021-08-09: 10 mg via INTRAVENOUS
  Administered 2021-08-09: 40 mg via INTRAVENOUS
  Administered 2021-08-09 (×2): 50 mg via INTRAVENOUS
  Administered 2021-08-09 (×2): 30 mg via INTRAVENOUS

## 2021-08-09 MED ORDER — PHENYLEPHRINE HCL (PRESSORS) 10 MG/ML IV SOLN
INTRAVENOUS | Status: DC | PRN
  Administered 2021-08-09 (×2): 120 ug via INTRAVENOUS

## 2021-08-09 MED ORDER — METHOCARBAMOL 500 MG PO TABS: 500.0000 mg | ORAL_TABLET | Freq: Four times a day (QID) | ORAL | 0 refills | Status: AC | PRN

## 2021-08-09 MED ORDER — TRANEXAMIC ACID-NACL 1000-0.7 MG/100ML-% IV SOLN
1000.0000 mg | INTRAVENOUS | Status: AC
  Administered 2021-08-09: 1000 mg via INTRAVENOUS

## 2021-08-09 MED ORDER — FENTANYL CITRATE PF 50 MCG/ML IJ SOSY: 25.0000 ug | PREFILLED_SYRINGE | INTRAMUSCULAR | Status: DC | PRN

## 2021-08-09 MED ORDER — BUPIVACAINE-EPINEPHRINE (PF) 0.25% -1:200000 IJ SOLN
INTRAMUSCULAR | Status: AC
  Filled 2021-08-09: qty 30

## 2021-08-09 MED ORDER — FENTANYL CITRATE (PF) 100 MCG/2ML IJ SOLN
INTRAMUSCULAR | Status: AC
  Filled 2021-08-09: qty 2

## 2021-08-09 MED ORDER — ACETAMINOPHEN 160 MG/5ML PO SOLN: 325.0000 mg | ORAL | Status: DC | PRN

## 2021-08-09 MED ORDER — PHENYLEPHRINE HCL-NACL 20-0.9 MG/250ML-% IV SOLN
INTRAVENOUS | Status: DC | PRN
  Administered 2021-08-09: 25 ug/min via INTRAVENOUS

## 2021-08-09 MED ORDER — OXYCODONE HCL 5 MG PO TABS: 5.0000 mg | ORAL_TABLET | Freq: Once | ORAL | Status: DC | PRN

## 2021-08-09 MED ORDER — SODIUM CHLORIDE (PF) 0.9 % IJ SOLN
INTRAMUSCULAR | Status: DC | PRN
Start: 2021-08-09 — End: 2021-08-10
  Administered 2021-08-09: 30 mL via INTRAVENOUS

## 2021-08-09 MED ORDER — ASPIRIN 81 MG PO CHEW: 81.0000 mg | CHEWABLE_TABLET | Freq: Two times a day (BID) | ORAL | 0 refills | Status: AC

## 2021-08-09 MED ORDER — CHLORHEXIDINE GLUCONATE 0.12 % MT SOLN: 15.0000 mL | Freq: Once | OROMUCOSAL | Status: AC

## 2021-08-09 MED ORDER — WATER FOR IRRIGATION, STERILE IR SOLN
Status: DC | PRN
  Administered 2021-08-09: 2000 mL

## 2021-08-09 MED ORDER — MIDAZOLAM HCL 2 MG/2ML IJ SOLN
INTRAMUSCULAR | Status: AC
  Filled 2021-08-09: qty 2

## 2021-08-09 MED ORDER — CEFAZOLIN SODIUM-DEXTROSE 2-4 GM/100ML-% IV SOLN: 2.0000 g | Freq: Four times a day (QID) | INTRAVENOUS | Status: DC

## 2021-08-09 MED ORDER — ONDANSETRON HCL 4 MG/2ML IJ SOLN
INTRAMUSCULAR | Status: DC | PRN
  Administered 2021-08-09: 4 mg via INTRAVENOUS

## 2021-08-09 MED ORDER — TRANEXAMIC ACID-NACL 1000-0.7 MG/100ML-% IV SOLN
INTRAVENOUS | Status: AC
  Filled 2021-08-09: qty 100

## 2021-08-09 MED ORDER — TRANEXAMIC ACID-NACL 1000-0.7 MG/100ML-% IV SOLN: 1000.0000 mg | INTRAVENOUS | Status: DC

## 2021-08-09 MED ORDER — MIDAZOLAM HCL 5 MG/5ML IJ SOLN
INTRAMUSCULAR | Status: DC | PRN
  Administered 2021-08-09: 2 mg via INTRAVENOUS

## 2021-08-09 MED ORDER — CEFAZOLIN SODIUM-DEXTROSE 2-4 GM/100ML-% IV SOLN: 2.0000 g | INTRAVENOUS | Status: DC

## 2021-08-09 MED ORDER — MELOXICAM 15 MG PO TABS: 15.0000 mg | ORAL_TABLET | Freq: Every day | ORAL | 3 refills | Status: AC

## 2021-08-09 MED ORDER — ONDANSETRON HCL 4 MG/2ML IJ SOLN
INTRAMUSCULAR | Status: AC
  Filled 2021-08-09: qty 2

## 2021-08-09 MED ORDER — OXYCODONE HCL 5 MG/5ML PO SOLN: 5.0000 mg | Freq: Once | ORAL | Status: DC | PRN

## 2021-08-09 MED ORDER — ONDANSETRON HCL 4 MG/2ML IJ SOLN: 4.0000 mg | Freq: Once | INTRAMUSCULAR | Status: DC | PRN

## 2021-08-09 MED ORDER — PROPOFOL 1000 MG/100ML IV EMUL
INTRAVENOUS | Status: AC
  Filled 2021-08-09: qty 100

## 2021-08-09 MED ORDER — CEFAZOLIN SODIUM-DEXTROSE 2-4 GM/100ML-% IV SOLN
INTRAVENOUS | Status: AC
  Filled 2021-08-09: qty 100

## 2021-08-09 SURGICAL SUPPLY — 58 items
ADH SKN CLS APL DERMABOND .7 (GAUZE/BANDAGES/DRESSINGS) ×1
APL PRP STRL LF DISP 70% ISPRP (MISCELLANEOUS) ×1
BAG COUNTER SPONGE SURGICOUNT (BAG) ×1 IMPLANT
BAG DECANTER FOR FLEXI CONT (MISCELLANEOUS) IMPLANT
BAG SPEC THK2 15X12 ZIP CLS (MISCELLANEOUS) ×1
BAG SPNG CNTER NS LX DISP (BAG) ×1
BAG ZIPLOCK 12X15 (MISCELLANEOUS) ×1 IMPLANT
CHLORAPREP W/TINT 26 (MISCELLANEOUS) ×2 IMPLANT
COVER PERINEAL POST (MISCELLANEOUS) ×2 IMPLANT
COVER SURGICAL LIGHT HANDLE (MISCELLANEOUS) ×2 IMPLANT
DERMABOND ADVANCED (GAUZE/BANDAGES/DRESSINGS) ×1
DERMABOND ADVANCED .7 DNX12 (GAUZE/BANDAGES/DRESSINGS) ×2 IMPLANT
DRAPE IMP U-DRAPE 54X76 (DRAPES) ×2 IMPLANT
DRAPE SHEET LG 3/4 BI-LAMINATE (DRAPES) ×6 IMPLANT
DRAPE STERI IOBAN 125X83 (DRAPES) ×2 IMPLANT
DRAPE U-SHAPE 47X51 STRL (DRAPES) ×4 IMPLANT
DRSG AQUACEL AG ADV 3.5X10 (GAUZE/BANDAGES/DRESSINGS) ×2 IMPLANT
ELECT REM PT RETURN 15FT ADLT (MISCELLANEOUS) ×2 IMPLANT
GAUZE SPONGE 4X4 12PLY STRL (GAUZE/BANDAGES/DRESSINGS) ×2 IMPLANT
GLOVE SURG ENC MOIS LTX SZ8.5 (GLOVE) ×4 IMPLANT
GLOVE SURG UNDER POLY LF SZ8.5 (GLOVE) ×2 IMPLANT
GOWN SPEC L3 XXLG W/TWL (GOWN DISPOSABLE) ×4 IMPLANT
HANDPIECE INTERPULSE COAX TIP (DISPOSABLE) ×2
HEAD CERAMIC BIOLOX 32 TP1 -3 (Head) ×1 IMPLANT
HOLDER FOLEY CATH W/STRAP (MISCELLANEOUS) ×2 IMPLANT
HOOD PEEL AWAY FLYTE STAYCOOL (MISCELLANEOUS) ×6 IMPLANT
KIT TURNOVER KIT A (KITS) ×1 IMPLANT
LINER G7 VIT E NTRL 32 SZD HIP (Liner) ×1 IMPLANT
MANIFOLD NEPTUNE II (INSTRUMENTS) ×2 IMPLANT
MARKER SKIN DUAL TIP RULER LAB (MISCELLANEOUS) ×2 IMPLANT
NDL SAFETY ECLIPSE 18X1.5 (NEEDLE) ×1 IMPLANT
NDL SPNL 18GX3.5 QUINCKE PK (NEEDLE) ×1 IMPLANT
NEEDLE HYPO 18GX1.5 SHARP (NEEDLE) ×2
NEEDLE SPNL 18GX3.5 QUINCKE PK (NEEDLE) ×2 IMPLANT
PACK ANTERIOR HIP CUSTOM (KITS) ×2 IMPLANT
PENCIL SMOKE EVACUATOR (MISCELLANEOUS) ×1 IMPLANT
SAW OSC TIP CART 19.5X105X1.3 (SAW) ×2 IMPLANT
SEALER BIPOLAR AQUA 6.0 (INSTRUMENTS) ×2 IMPLANT
SET HNDPC FAN SPRY TIP SCT (DISPOSABLE) ×1 IMPLANT
SHELL ACET G7 3H 50 SZD (Shell) ×1 IMPLANT
SOLUTION PRONTOSAN WOUND 350ML (IRRIGATION / IRRIGATOR) ×2 IMPLANT
SPIKE FLUID TRANSFER (MISCELLANEOUS) ×2 IMPLANT
SPONGE T-LAP 18X18 ~~LOC~~+RFID (SPONGE) ×6 IMPLANT
STAPLER INSORB 30 2030 C-SECTI (MISCELLANEOUS) IMPLANT
STEM FEM REDUCE DISTAL 11X107 (Stem) ×1 IMPLANT
SUT MNCRL AB 3-0 PS2 18 (SUTURE) ×2 IMPLANT
SUT MON AB 2-0 CT1 36 (SUTURE) ×2 IMPLANT
SUT STRATAFIX PDO 1 14 VIOLET (SUTURE) ×2
SUT STRATFX PDO 1 14 VIOLET (SUTURE) ×1
SUT VIC AB 2-0 CT1 27 (SUTURE) ×2
SUT VIC AB 2-0 CT1 TAPERPNT 27 (SUTURE) IMPLANT
SUTURE STRATFX PDO 1 14 VIOLET (SUTURE) ×1 IMPLANT
SYR 3ML LL SCALE MARK (SYRINGE) ×2 IMPLANT
TRAY FOL W/BAG SLVR 16FR STRL (SET/KITS/TRAYS/PACK) IMPLANT
TRAY FOLEY MTR SLVR 16FR STAT (SET/KITS/TRAYS/PACK) IMPLANT
TRAY FOLEY W/BAG SLVR 16FR LF (SET/KITS/TRAYS/PACK) ×2
TUBE SUCTION HIGH CAP CLEAR NV (SUCTIONS) ×2 IMPLANT
WATER STERILE IRR 1000ML POUR (IV SOLUTION) ×2 IMPLANT

## 2021-08-09 NOTE — Anesthesia Procedure Notes (Signed)
Procedure Name: [place] ?Date/Time: 08/09/2021 11:07 AM ?Performed by: Lissa Morales, CRNA ?Pre-anesthesia Checklist: Patient identified, Emergency Drugs available, Suction available and Patient being monitored ?Patient Re-evaluated:Patient Re-evaluated prior to induction ?Oxygen Delivery Method: Simple face mask ?Preoxygenation: Pre-oxygenation with 100% oxygen ?Placement Confirmation: positive ETCO2 ? ? ? ? ?

## 2021-08-09 NOTE — Evaluation (Signed)
Physical Therapy Evaluation ?Patient Details ?Name: Kelsey Cuevas ?MRN: [id number] ?DOB: [birth year] ?Today's Date: 08/09/2021 ? ?History of Present Illness ? Pt is a 46yo female presenting s/p L-THA, anterior approach on 08/09/21. PMH: OA, hyperlipidemia, HTN.  ?Clinical Impression ? Kelsey Cuevas is a 46 y.o. female POD 0 s/p L-THA, anterior approach. Patient reports independence with mobility at baseline. Patient is now limited by functional impairments (see PT problem list below) and requires min guard for transfers and gait with RW. Patient was able to ambulate 30 feet with RW and min guard assist and cues for safe walker management. Patient instructed in exercises to facilitate ROM and circulation and appropriate progression as well as walking program. Patient will benefit from continued skilled PT interventions to address impairments and progress towards PLOF. Patient has met mobility goals at adequate level for discharge home; will continue to follow if pt continues acute stay to progress towards Mod I goals. ?   ?   ? ?Recommendations for follow up therapy are one component of a multi-disciplinary discharge planning process, led by the attending physician.  Recommendations may be updated based on patient status, additional functional criteria and insurance authorization. ? ?Follow Up Recommendations Follow physician's recommendations for discharge plan and follow up therapies ? ?  ?Assistance Recommended at Discharge Set up Supervision/Assistance  ?Patient can return home with the following ? A little help with walking and/or transfers;A little help with bathing/dressing/bathroom;Assistance with cooking/housework;Assist for transportation;Help with stairs or ramp for entrance ? ?  ?Equipment Recommendations None recommended by PT  ?Recommendations for Other Services ?    ?  ?Functional Status Assessment Patient has had a recent decline in their functional status and demonstrates the ability to make  significant improvements in function in a reasonable and predictable amount of time.  ? ?  ?Precautions / Restrictions Precautions ?Precautions: None ?Restrictions ?Weight Bearing Restrictions: No  ? ?  ? ?Mobility ? Bed Mobility ?Overal bed mobility: Needs Assistance ?Bed Mobility: Supine to Sit ?  ?  ?Supine to sit: Min guard ?  ?  ?General bed mobility comments: Pt min guard for safety only, no physical assist required. ?  ? ?Transfers ?Overall transfer level: Needs assistance ?Equipment used: Rolling walker (2 wheels) ?Transfers: Sit to/from Stand ?Sit to Stand: Min guard ?  ?  ?  ?  ?  ?General transfer comment: Pt min guard for safety only, no physical assist required. Pt able to complete standing marching and weightshifts at EOB without overt LOB. ?  ? ?Ambulation/Gait ?Ambulation/Gait assistance: Min guard, Supervision ?Gait Distance (Feet): 30 Feet ?Assistive device: Rolling walker (2 wheels) ?Gait Pattern/deviations: Step-to pattern, Decreased step length - left, Decreased stance time - left ?Gait velocity: decreased ?  ?  ?General Gait Details: Pt ambulated with RW and min guard assist progressed to supervision toward end of ambulation task, no overt LOB noted. Demonstrated shortened step length on left. VCs for sequencing and proximity to device. ? ?Stairs ?  ?  ?  ?  ?  ? ?Wheelchair Mobility ?  ? ?Modified Rankin (Stroke Patients Only) ?  ? ?  ? ?Balance Overall balance assessment: Needs assistance ?Sitting-balance support: Feet supported, No upper extremity supported ?Sitting balance-Leahy Scale: Fair ?  ?  ?Standing balance support: Bilateral upper extremity supported, During functional activity, Reliant on assistive device for balance ?Standing balance-Leahy Scale: Poor ?  ?  ?  ?  ?  ?  ?  ?  ?  ?  ?  ?  ?   ? ? ? ?  Pertinent Vitals/Pain Pain Assessment ?Pain Assessment: 0-10 ?Pain Score: 3  ?Pain Location: L hip ?Pain Descriptors / Indicators: Operative site guarding, Burning, Discomfort,  Guarding ?Pain Intervention(s): Monitored during session, Repositioned, Limited activity within patient's tolerance  ? ? ?Home Living Family/patient expects to be discharged to:: Private residence ?Living Arrangements: Alone ?Available Help at Discharge: Family;Available 24 hours/day (Mom [place]) ?Type of Home: Apartment ?Home Access: Ramped entrance ?  ?  ?  ?Home Layout: One level ?Home Equipment: Conservation officer, nature (2 wheels);Cane - single point ?   ?  ?Prior Function Prior Level of Function : Independent/Modified Independent ?  ?  ?  ?  ?  ?  ?Mobility Comments: ind ?ADLs Comments: ind ?  ? ? ?Hand Dominance  ? Dominant Hand: Right ? ?  ?Extremity/Trunk Assessment  ? Upper Extremity Assessment ?Upper Extremity Assessment: Overall WFL for tasks assessed ?  ? ?Lower Extremity Assessment ?Lower Extremity Assessment: RLE deficits/detail;LLE deficits/detail ?RLE Deficits / Details: MMT ank df/pf 5/5 ?RLE Sensation: WNL ?LLE Deficits / Details: MMT: ank df/pf 5/5 ?LLE Sensation: WNL ?  ? ?Cervical / Trunk Assessment ?Cervical / Trunk Assessment: Normal  ?Communication  ? Communication: No difficulties  ?Cognition Arousal/Alertness: Awake/alert ?Behavior During Therapy: [facility] for tasks assessed/performed ?Overall Cognitive Status: Within Functional Limits for tasks assessed ?  ?  ?  ?  ?  ?  ?  ?  ?  ?  ?  ?  ?  ?  ?  ?  ?  ?  ?  ? ?  ?General Comments   ? ?  ?Exercises Total Joint Exercises ?Ankle Circles/Pumps: AROM, 20 reps, Both ?Quad Sets: AROM, Left, 5 reps ?Short Arc Quad: Other (comment) (educated, not performed) ?Heel Slides: Other (comment) (educated, not performed) ?Straight Leg Raises:  (educated, not performed)  ? ?Assessment/Plan  ?  ?PT Assessment Patient needs continued PT services  ?PT Problem List Decreased strength;Decreased range of motion;Decreased activity tolerance;Decreased balance;Decreased mobility;Decreased coordination;Decreased knowledge of use of DME;Decreased safety awareness;Pain ? ?   ?  ?PT  Treatment Interventions DME instruction;Gait training;Stair training;Functional mobility training;Therapeutic activities;Therapeutic exercise;Balance training;Neuromuscular re-education;Patient/family education   ? ?PT Goals (Current goals can be found in the Care Plan section)  ?Acute Rehab PT Goals ?Patient Stated Goal: I want to walk further ?PT Goal Formulation: With patient ?Time For Goal Achievement: 08/16/21 ?Potential to Achieve Goals: Good ? ?  ?Frequency 7X/week ?  ? ? ?Co-evaluation   ?  ?  ?  ?  ? ? ?  ?AM-PAC PT "6 Clicks" Mobility  ?Outcome Measure Help needed turning from your back to your side while in a flat bed without using bedrails?: None ?Help needed moving from lying on your back to sitting on the side of a flat bed without using bedrails?: A Little ?Help needed moving to and from a bed to a chair (including a wheelchair)?: A Little ?Help needed standing up from a chair using your arms (e.g., wheelchair or bedside chair)?: A Little ?Help needed to walk in hospital room?: A Little ?Help needed climbing 3-5 steps with a railing? : A Little ?6 Click Score: 19 ? ?  ?End of Session Equipment Utilized During Treatment: Gait belt ?Activity Tolerance: Patient tolerated treatment well ?Patient left: in chair;with call bell/phone within reach;with family/visitor present ?Nurse Communication: Mobility status ?PT Visit Diagnosis: Pain;Difficulty in walking, not elsewhere classified (R26.2) ?Pain - Right/Left: Left ?Pain - part of body: Hip ?  ? ?Time: 4327-6147 ?PT Time Calculation (min) (ACUTE ONLY): 45  min ? ? ?Charges:   PT Evaluation ?$PT Eval Low Complexity: 1 Low ?PT Treatments ?$Gait Training: 23-37 mins ?  ?   ? ? ?Coolidge Breeze, PT, DPT ?[facility] ?Office: [phone] ?Pager: [phone] ? ? ?Coolidge Breeze ?08/09/2021, 4:51 PM ?

## 2021-08-09 NOTE — Anesthesia Preprocedure Evaluation (Addendum)
Anesthesia Evaluation  ?Patient identified by MRN, date of birth, ID band ?Patient awake ? ? ? ?Reviewed: ?Allergy & Precautions, H&P , NPO status , Patient's Chart, lab work & pertinent test results, reviewed documented beta blocker date and time  ? ?Airway ?Mallampati: I ? ?TM Distance: >3 FB ?Neck ROM: full ? ? ? Dental ?no notable dental hx. ?(+) Teeth Intact, Dental Advisory Given ?  ?Pulmonary ?neg pulmonary ROS, Current Smoker and Patient abstained from smoking.,  ?  ?Pulmonary exam normal ?breath sounds clear to auscultation ? ? ? ? ? ? Cardiovascular ?Exercise Tolerance: Good ?hypertension, Pt. on medications ?negative cardio ROS ? ? ?Rhythm:regular Rate:Normal ? ? ?  ?Neuro/Psych ?PSYCHIATRIC DISORDERS Anxiety Depression negative neurological ROS ?   ? GI/Hepatic ?negative GI ROS, Neg liver ROS,   ?Endo/Other  ?negative endocrine ROS ? Renal/GU ?negative Renal ROS  ?negative genitourinary ?  ?Musculoskeletal ? ?(+) Arthritis , Osteoarthritis,   ? Abdominal ?  ?Peds ? Hematology ?negative hematology ROS ?(+)   ?Anesthesia Other Findings ? ? Reproductive/Obstetrics ?negative OB ROS ? ?  ? ? ? ? ? ? ? ? ? ? ? ? ? ?  ?  ? ? ? ? ? ? ? ?Anesthesia Physical ?Anesthesia Plan ? ?ASA: 2 ? ?Anesthesia Plan: MAC and Spinal  ? ?Post-op Pain Management: Minimal or no pain anticipated  ? ?Induction: Intravenous ? ?PONV Risk Score and Plan: 2 and Treatment may vary due to age or medical condition and Propofol infusion ? ?Airway Management Planned: Nasal Cannula, Simple Face Mask and Natural Airway ? ?Additional Equipment: None ? ?Intra-op Plan:  ? ?Post-operative Plan:  ? ?Informed Consent: I have reviewed the patients History and Physical, chart, labs and discussed the procedure including the risks, benefits and alternatives for the proposed anesthesia with the patient or authorized representative who has indicated his/her understanding and acceptance.  ? ? ? ?Dental Advisory  Given ? ?Plan Discussed with: CRNA and Anesthesiologist ? ?Anesthesia Plan Comments:   ? ? ? ? ? ? ?Anesthesia Quick Evaluation ? ?

## 2021-08-09 NOTE — Op Note (Signed)
OPERATIVE REPORT ? ?SURGEON: Rod Can, MD  ? ?ASSISTANT: Nehemiah Massed, PA-C ?Larene Pickett, PA-C ? ?PREOPERATIVE DIAGNOSIS: Left hip arthritis.  ? ?POSTOPERATIVE DIAGNOSIS: Left hip arthritis.  ? ?PROCEDURE: Left total hip arthroplasty, anterior approach.  ? ?IMPLANTS: Biomet Taperloc Complete Microplasty stem, size 11 x 107.5 mm, high offset. ?Biomet G7 OsseoTi Cup, size 50 mm. ?Biomet Vivacit-E liner, size 32 mm, D, neutral. ?Biomet Biolox ceramic head ball, size 32 - 3 mm. ? ?ANESTHESIA:  MAC and Spinal ? ?ESTIMATED BLOOD LOSS:-300 mL   ? ?ANTIBIOTICS: 2 g Ancef. ? ?DRAINS: None. ? ?COMPLICATIONS: None. ?  ?CONDITION: PACU - hemodynamically stable.  ? ?BRIEF CLINICAL NOTE: Kelsey Cuevas is a 46 y.o. female with a long-standing history of Left hip arthritis. After failing conservative management, the patient was indicated for total hip arthroplasty. The risks, benefits, and alternatives to the procedure were explained, and the patient elected to proceed. ? ?PROCEDURE IN DETAIL: Surgical site was marked by myself in the pre-op holding area. Once inside the operating room, spinal anesthesia was obtained, and a foley catheter was inserted. The patient was then positioned on the Hana table.  All bony prominences were well padded.  The hip was prepped and draped in the normal sterile surgical fashion.  A time-out was called verifying side and site of surgery. The patient received IV antibiotics within 60 minutes of beginning the procedure. ?  ?Bikini incision was made, and superficial dissection was performed lateral to the ASIS. The direct anterior approach to the hip was performed through the Hueter interval.  Lateral femoral circumflex vessels were treated with the Auqumantys. The anterior capsule was exposed and an inverted T capsulotomy was made. The femoral neck cut was made to the level of the templated cut.  A corkscrew was placed into the head and the head was removed.  The femoral head was  found to have eburnated bone. The head was passed to the back table and was measured. Pubofemoral ligament was released off of the calcar, taking care to stay on bone. Superior capsule was released from the greater trochanter, taking care to stay lateral to the posterior border of the femoral neck in order to preserve the short external rotators. ?  ?Acetabular exposure was achieved, and the pulvinar and labrum were excised. Sequential reaming of the acetabulum was then performed up to a size 49 mm reamer. A 50 mm cup was then opened and impacted into place at approximately 40 degrees of abduction and 20 degrees of anteversion. The final polyethylene liner was impacted into place and acetabular osteophytes were removed.  ?  ?I then gained femoral exposure taking care to protect the abductors and greater trochanter.  This was performed using standard external rotation, extension, and adduction.  A cookie cutter was used to enter the femoral canal, and then the femoral canal finder was placed.  Sequential broaching was performed up to a size 11.  Calcar planer was used on the femoral neck remnant.  I placed a high offset neck and a trial head ball.  The hip was reduced.  Leg lengths and offset were checked fluoroscopically.  The hip was dislocated and trial components were removed.  The final implants were placed, and the hip was reduced.  Fluoroscopy was used to confirm component position and leg lengths.  At 90 degrees of external rotation and full extension, the hip was stable to an anterior directed force. ?  ?The wound was copiously irrigated with Prontosan solution and normal saline  using pule lavage.  Marcaine solution was injected into the periarticular soft tissue.  The wound was closed in layers using #1 Vicryl and V-Loc for the fascia, 2-0 Vicryl for the subcutaneous fat, 2-0 Monocryl for the deep dermal layer, 3-0 running Monocryl subcuticular stitch, and Dermabond for the skin.  Once the glue was fully  dried, an Aquacell Ag dressing was applied.  The patient was transported to the recovery room in stable condition.  Sponge, needle, and instrument counts were correct at the end of the case x2.  The patient tolerated the procedure well and there were no known complications. ? ?Please note that a surgical assistant was a medical necessity for this procedure to perform it in a safe and expeditious manner. Assistant was necessary to provide appropriate retraction of vital neurovascular structures, to prevent femoral fracture, and to allow for anatomic placement of the prosthesis. ?

## 2021-08-09 NOTE — Transfer of Care (Signed)
Immediate Anesthesia Transfer of Care Note ? ?Patient: Kelsey Cuevas [facility] ? ?Procedure(s) Performed: TOTAL HIP ARTHROPLASTY ANTERIOR APPROACH (Left: Hip) ? ?Patient Location: PACU ? ?Anesthesia Type:Spinal ? ?Level of Consciousness: drowsy and patient cooperative ? ?Airway & Oxygen Therapy: Patient Spontanous Breathing and Patient connected to face mask oxygen ? ?Post-op Assessment: Report given to RN and Post -op Vital signs reviewed and stable ? ?Post vital signs: stable ? ?Last Vitals:  ?Vitals Value Taken Time  ?BP 91/57 08/09/21 1404  ?Temp    ?Pulse 61 08/09/21 1407  ?Resp 17 08/09/21 1407  ?SpO2 100 % 08/09/21 1407  ?Vitals shown include unvalidated device data. ? ?Last Pain:  ?Vitals:  ? 08/09/21 0757  ?TempSrc: Oral  ?   ? ?  ? ?Complications: No notable events documented. ?

## 2021-08-09 NOTE — Interval H&P Note (Signed)
History and Physical Interval Note: ? ?08/09/2021 ?9:52 AM ? ?Kelsey Cuevas  has presented today for surgery, with the diagnosis of left hip osteoarthritis.  The various methods of treatment have been discussed with the patient and family. After consideration of risks, benefits and other options for treatment, the patient has consented to  Procedure(s): ?TOTAL HIP ARTHROPLASTY ANTERIOR APPROACH (Left) as a surgical intervention.  The patient's history has been reviewed, patient examined, no change in status, stable for surgery.  I have reviewed the patient's chart and labs.  Questions were answered to the patient's satisfaction.   ? ? ?Kelsey Cuevas ? ? ?

## 2021-08-09 NOTE — Anesthesia Postprocedure Evaluation (Signed)
Anesthesia Post Note ? ?Patient: Kelsey Cuevas [facility] ? ?Procedure(s) Performed: TOTAL HIP ARTHROPLASTY ANTERIOR APPROACH (Left: Hip) ? ?  ? ?Patient location during evaluation: PACU ?Anesthesia Type: MAC ?Level of consciousness: awake and alert ?Pain management: pain level controlled ?Vital Signs Assessment: post-procedure vital signs reviewed and stable ?Respiratory status: spontaneous breathing, nonlabored ventilation, respiratory function stable and patient connected to nasal cannula oxygen ?Cardiovascular status: stable and blood pressure returned to baseline ?Postop Assessment: no apparent nausea or vomiting ?Anesthetic complications: no ? ? ?No notable events documented. ? ?Last Vitals:  ?Vitals:  ? 08/09/21 1530 08/09/21 1704  ?BP: 131/68 (!) 152/83  ?Pulse: 64 71  ?Resp: 15 15  ?Temp:  36.6 ?C  ?SpO2: 99% 97%  ?  ?Last Pain:  ?Vitals:  ? 08/09/21 1704  ?TempSrc:   ?PainSc: 2   ? ? ?  ?  ?  ?  ?  ?  ? ?Kelsey Cuevas ? ? ? ? ?

## 2021-08-09 NOTE — Anesthesia Procedure Notes (Addendum)
Spinal ? ?Patient location during procedure: OR ?End time: 08/09/2021 11:23 AM ?Staffing ?Performed: anesthesiologist  ?Anesthesiologist: Bethena Midget, MD ?Preanesthetic Checklist ?Completed: patient identified, IV checked, risks and benefits discussed, surgical consent, monitors and equipment checked, pre-op evaluation and timeout performed ?Spinal Block ?Patient position: sitting ?Prep: DuraPrep ?Patient monitoring: heart rate, continuous pulse ox and blood pressure ?Approach: midline ?Location: L3-4 ?Needle ?Needle type: Spinocan  ?Needle gauge: 24 G ?Needle length: 9 cm ?Assessment ?Sensory level: T4 ?Events: CSF return ?Additional Notes ?Monitors applied, IV functioning, Sterile prep/drape/gloves used, skin prepped and anesthetized with lidocaine, free flow of clear CSF prior to injecting local anesthetic, negative heme, negative paraesthesia, needle carefully withdrawn, patient tolerated procedure well.  ? ? ? ?

## 2021-08-09 NOTE — Discharge Instructions (Signed)
? ?Dr. Felicity Penix ?Joint Replacement Specialist ?[facility] ?[address] ?[place], [place] [address] ?[phone] ? ? ?TOTAL HIP REPLACEMENT POSTOPERATIVE DIRECTIONS ? ? ? ?Hip Rehabilitation, Guidelines Following Surgery  ? ?WEIGHT BEARING ?Weight bearing as tolerated with assist device (walker, cane, etc) as directed, use it as long as suggested by your surgeon or therapist, typically at least 4-6 weeks. ? ?The results of a hip operation are greatly improved after range of motion and muscle strengthening exercises. Follow all safety measures which are given to protect your hip. If any of these exercises cause increased pain or swelling in your joint, decrease the amount until you are comfortable again. Then slowly increase the exercises. Call your caregiver if you have problems or questions.  ? ?HOME CARE INSTRUCTIONS  ?Most of the following instructions are designed to prevent the dislocation of your new hip.  ?Remove items at home which could result in a fall. This includes throw rugs or furniture in walking pathways.  ?Continue medications as instructed at time of discharge. ?You may have some home medications which will be placed on hold until you complete the course of blood thinner medication. ?You may start showering once you are discharged home. Do not remove your dressing. ?Do not put on socks or shoes without following the instructions of your caregivers.   ?Sit on chairs with arms. Use the chair arms to help push yourself up when arising.  ?Arrange for the use of a toilet seat elevator so you are not sitting low.  ?Walk with walker as instructed.  ?You may resume a sexual relationship in one month or when given the OK by your caregiver.  ?Use walker as long as suggested by your caregivers.  ?You may put full weight on your legs and walk as much as is comfortable. ?Avoid periods of inactivity such as sitting longer than an hour when not asleep. This helps prevent blood  clots.  ?You may return to work once you are cleared by your surgeon.  ?Do not drive a car for 6 weeks or until released by your surgeon.  ?Do not drive while taking narcotics.  ?Wear elastic stockings for two weeks following surgery during the day but you may remove then at night.  ?Make sure you keep all of your appointments after your operation with all of your doctors and caregivers. You should call the office at the above phone number and make an appointment for approximately two weeks after the date of your surgery. ?Please pick up a stool softener and laxative for home use as long as you are requiring pain medications. ?ICE to the affected hip every three hours for 30 minutes at a time and then as needed for pain and swelling. Continue to use ice on the hip for pain and swelling from surgery. You may notice swelling that will progress down to the foot and ankle.  This is normal after surgery.  Elevate the leg when you are not up walking on it.   ?It is important for you to complete the blood thinner medication as prescribed by your doctor. ?Continue to use the breathing machine which will help keep your temperature down.  It is common for your temperature to cycle up and down following surgery, especially at night when you are not up moving around and exerting yourself.  The breathing machine keeps your lungs expanded and your temperature down. ? ?RANGE OF MOTION AND STRENGTHENING EXERCISES  ?These exercises are designed to help you   keep full movement of your hip joint. Follow your caregiver's or physical therapist's instructions. Perform all exercises about fifteen times, three times per day or as directed. Exercise both hips, even if you have had only one joint replacement. These exercises can be done on a training (exercise) mat, on the floor, on a table or on a bed. Use whatever works the best and is most comfortable for you. Use music or television while you are exercising so that the exercises are a  pleasant break in your day. This will make your life better with the exercises acting as a break in routine you can look forward to.  ?Lying on your back, slowly slide your foot toward your buttocks, raising your knee up off the floor. Then slowly slide your foot back down until your leg is straight again.  ?Lying on your back spread your legs as far apart as you can without causing discomfort.  ?Lying on your side, raise your upper leg and foot straight up from the floor as far as is comfortable. Slowly lower the leg and repeat.  ?Lying on your back, tighten up the muscle in the front of your thigh (quadriceps muscles). You can do this by keeping your leg straight and trying to raise your heel off the floor. This helps strengthen the largest muscle supporting your knee.  ?Lying on your back, tighten up the muscles of your buttocks both with the legs straight and with the knee bent at a comfortable angle while keeping your heel on the floor.  ? ?SKILLED REHAB INSTRUCTIONS: ?If the patient is transferred to a skilled rehab facility following release from the hospital, a list of the current medications will be sent to the facility for the patient to continue.  When discharged from the skilled rehab facility, please have the facility set up the patient's Home Health Physical Therapy prior to being released. Also, the skilled facility will be responsible for providing the patient with their medications at time of release from the facility to include their pain medication and their blood thinner medication. If the patient is still at the rehab facility at time of the two week follow up appointment, the skilled rehab facility will also need to assist the patient in arranging follow up appointment in our office and any transportation needs. ? ?POST-OPERATIVE OPIOID TAPER INSTRUCTIONS: ?It is important to wean off of your opioid medication as soon as possible. If you do not need pain medication after your surgery it is ok  to stop day one. ?Opioids include: ?Codeine, Hydrocodone(Norco, Vicodin), Oxycodone(Percocet, oxycontin) and hydromorphone amongst others.  ?Long term and even short term use of opiods can cause: ?Increased pain response ?Dependence ?Constipation ?Depression ?Respiratory depression ?And more.  ?Withdrawal symptoms can include ?Flu like symptoms ?Nausea, vomiting ?And more ?Techniques to manage these symptoms ?Hydrate well ?Eat regular healthy meals ?Stay active ?Use relaxation techniques(deep breathing, meditating, yoga) ?Do Not substitute Alcohol to help with tapering ?If you have been on opioids for less than two weeks and do not have pain than it is ok to stop all together.  ?Plan to wean off of opioids ?This plan should start within one week post op of your joint replacement. ?Maintain the same interval or time between taking each dose and first decrease the dose.  ?Cut the total daily intake of opioids by one tablet each day ?Next start to increase the time between doses. ?The last dose that should be eliminated is the evening dose.  ? ? ?MAKE   SURE YOU:  ?Understand these instructions.  ?Will watch your condition.  ?Will get help right away if you are not doing well or get worse. ? ?Pick up stool softner and laxative for home use following surgery while on pain medications. ?Do not remove your dressing. ?The dressing is waterproof--it is OK to take showers. ?Continue to use ice for pain and swelling after surgery. ?Do not use any lotions or creams on the incision until instructed by your surgeon. ?Total Hip Protocol. ? ?

## 2021-08-09 NOTE — Progress Notes (Signed)
Took verbal order for Ancef from Dr. Lyla Glassing. 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 Dr. Lyla Glassing and Dr. Ambrose Pancoast aware. Plan is to proceed with caution by giving a small dose first.   ?

## 2021-08-09 NOTE — Anesthesia Procedure Notes (Deleted)
Spinal ? ?Patient location during procedure: OR ?End time: 08/09/2021 11:23 AM ?Reason for block: surgical anesthesia ?Staffing ?Performed: anesthesiologist  ?Anesthesiologist: Bethena Midget, MD ?Preanesthetic Checklist ?Completed: patient identified, IV checked, site marked, risks and benefits discussed, surgical consent, monitors and equipment checked, pre-op evaluation and timeout performed ?Spinal Block ?Patient position: sitting ?Prep: Betadine and DuraPrep ?Patient monitoring: heart rate, continuous pulse ox and blood pressure ?Approach: midline ?Location: L3-4 ?Injection technique: single-shot ?Needle ?Needle type: Pencan  ?Needle gauge: 24 G ?Needle length: 9 cm ?Assessment ?Sensory level: T4 ?Events: CSF return ?Additional Notes ? ? ? ? ? ? ?

## 2021-08-10 ENCOUNTER — Encounter (HOSPITAL_COMMUNITY): Payer: Self-pay | Admitting: Orthopedic Surgery

## 2021-11-13 IMAGING — US US EXTREM LOW VENOUS*L*
1 series · 13 of 24 positions shown · non-contrast
Comparison: None.

CLINICAL DATA: Left calf region pain

EXAM:
LEFT LOWER EXTREMITY VENOUS DUPLEX ULTRASOUND
TECHNIQUE: Gray-scale sonography with graded compression, as well as color
Doppler and duplex ultrasound were performed to evaluate the left
lower extremity deep venous systems from the level of the common
femoral vein and including the common femoral, femoral, profunda
femoral, popliteal and calf veins including the posterior tibial,
peroneal and gastrocnemius veins when visible. The superficial great
saphenous vein was also interrogated. Spectral Doppler was utilized
to evaluate flow at rest and with distal augmentation maneuvers in
the common femoral, femoral and popliteal veins.

[Series 1: us extrem low venous*left* · 0.08mm/px · 13 of 46 slices shown]
[im 1/46]
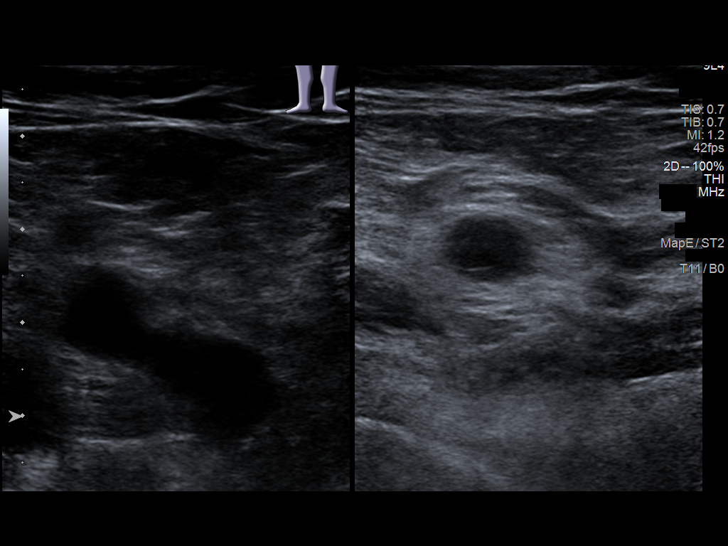
[im 4/46]
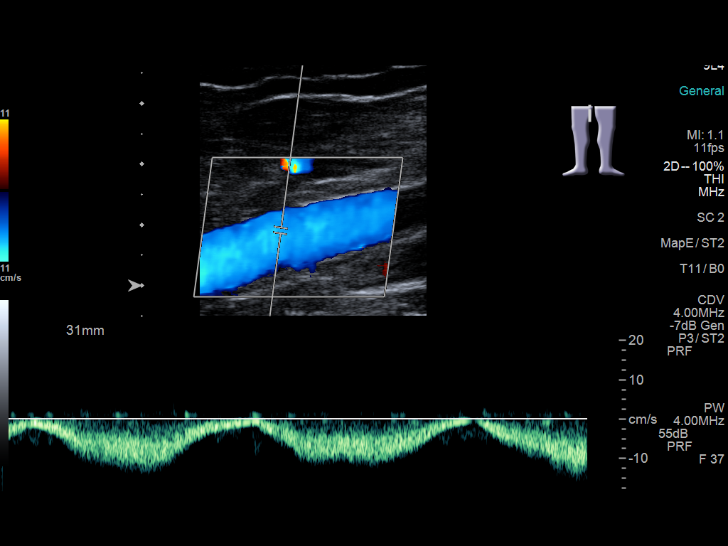
[im 8/46]
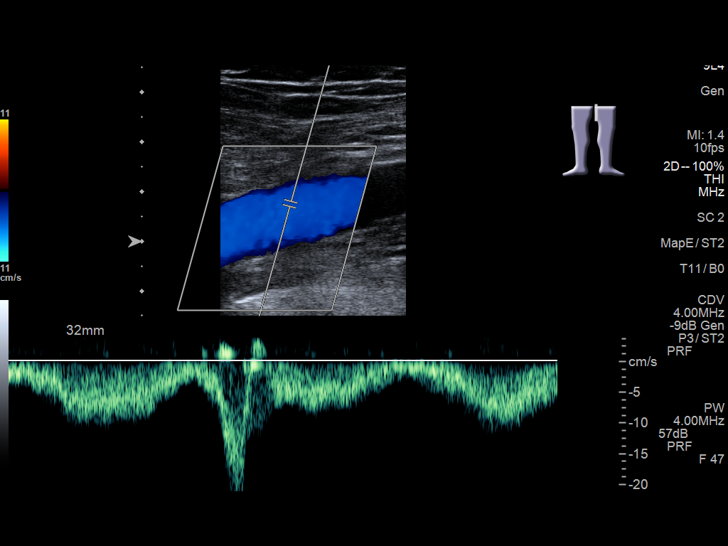
[im 12/46]
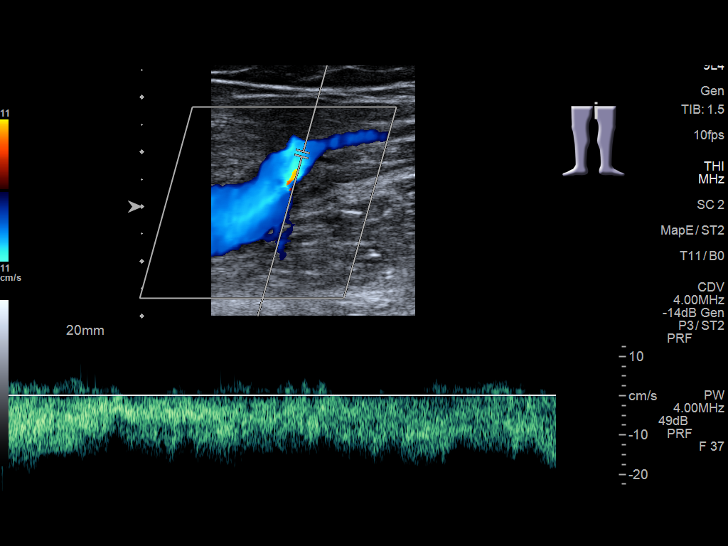
[im 16/46]
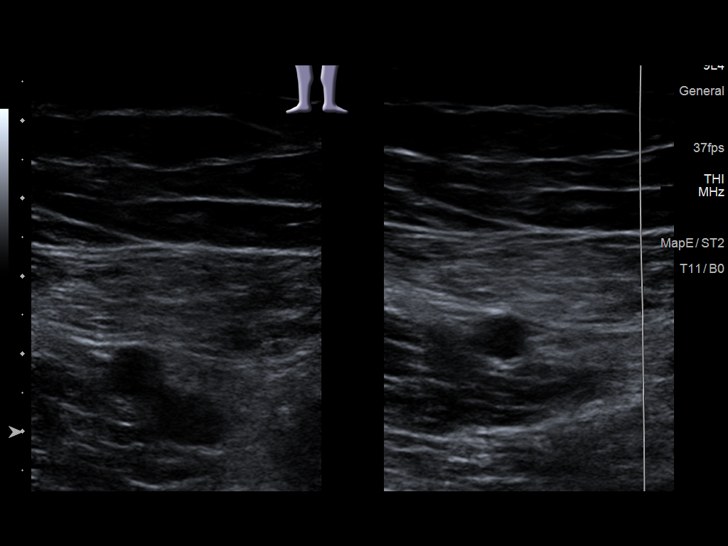
[im 20/46]
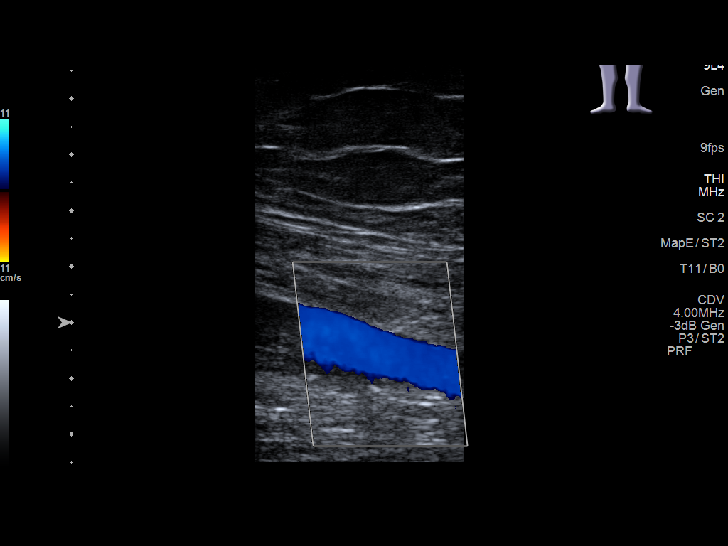
[im 24/46]
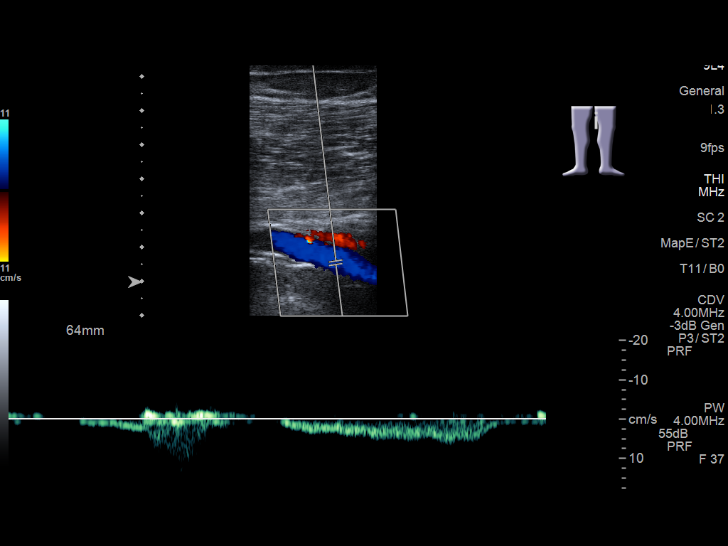
[im 26/46]
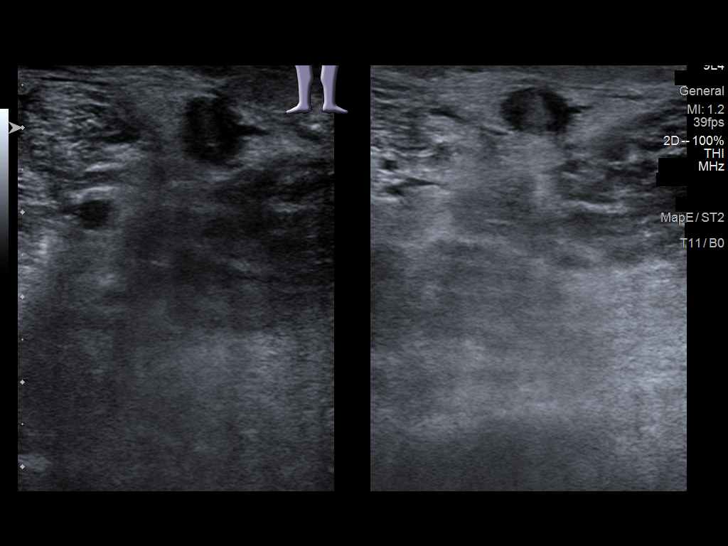
[im 30/46]
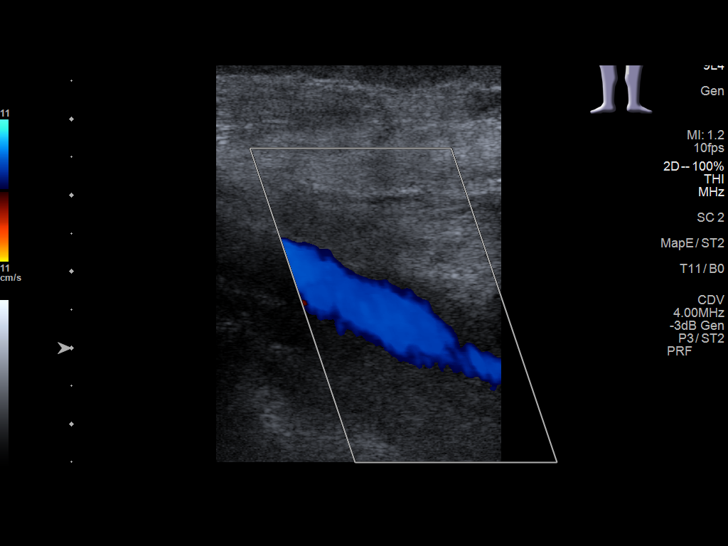
[im 34/46]
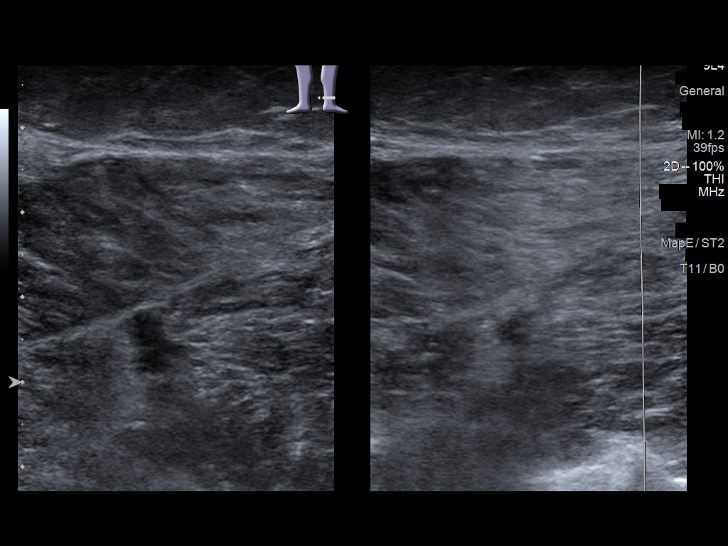
[im 38/46]
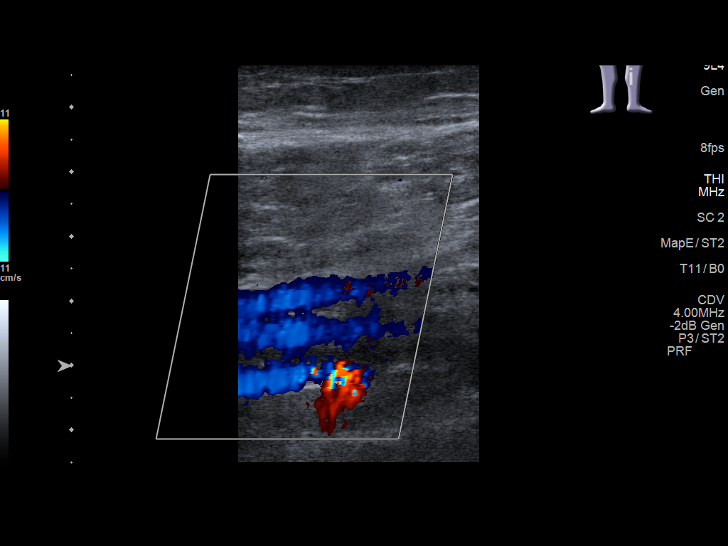
[im 42/46]
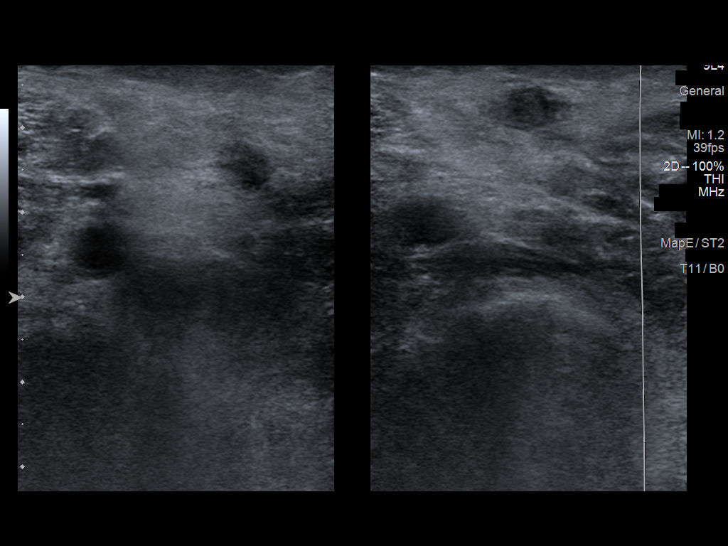
[im 46/46]
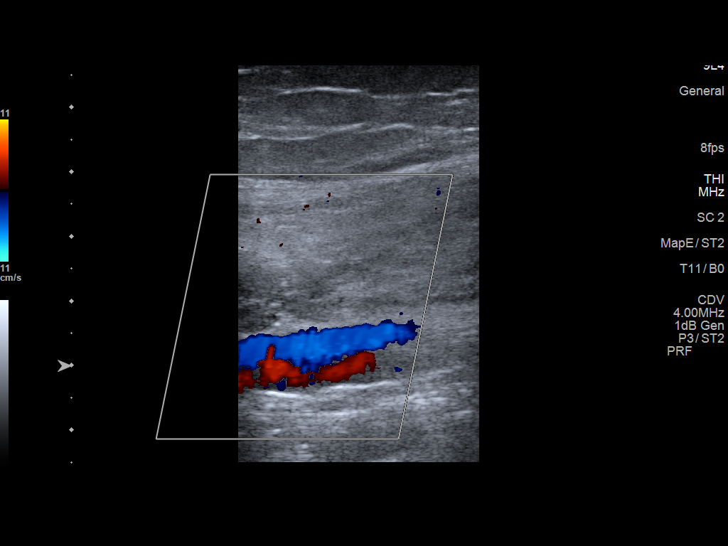

[13 of 24 positions shown; findings below may reference images not displayed]

FINDINGS: Contralateral Common Femoral Vein: Respiratory phasicity is normal
and symmetric with the symptomatic side. No evidence of thrombus.
Normal compressibility.

Common Femoral Vein: No evidence of thrombus. Normal
compressibility, respiratory phasicity and response to augmentation.

Saphenofemoral Junction: No evidence of thrombus. Normal
compressibility and flow on color Doppler imaging.

Profunda Femoral Vein: No evidence of thrombus. Normal
compressibility and flow on color Doppler imaging.

Femoral Vein: No evidence of thrombus. Normal compressibility,
respiratory phasicity and response to augmentation.

Popliteal Vein: No evidence of thrombus. Normal compressibility,
respiratory phasicity and response to augmentation.

Calf Veins: No evidence of thrombus. Normal compressibility and flow
on color Doppler imaging.

Superficial Great Saphenous Vein: No evidence of thrombus. Normal
compressibility.

Venous Reflux:  None.

Other Findings: There is acute appearing superficial thrombus in the
left lesser saphenous vein in the area of pain in the calf region.
There is loss compression augmentation in this area.
IMPRESSION: No evidence of deep venous thrombosis in the left lower extremity.
Right common femoral vein patent.

At the site of patient tenderness in the left calf region, there is
acute appearing superficial venous thrombosis in a portion of the
left lesser saphenous vein.

These results will be called to the ordering clinician or
representative by the Radiologist Assistant, and communication
documented in the PACS or [REDACTED].

## 2023-06-23 ENCOUNTER — Other Ambulatory Visit: Payer: Self-pay | Admitting: Internal Medicine

## 2023-06-23 DIAGNOSIS — Z0000 Encounter for general adult medical examination without abnormal findings: Secondary | ICD-10-CM

## 2023-07-08 ENCOUNTER — Ambulatory Visit
Admission: RE | Admit: 2023-07-08 | Discharge: 2023-07-08 | Disposition: A | Discharge status: Home / Self Care | Payer: 59 | Source: Ambulatory Visit | Attending: Internal Medicine | Admitting: Internal Medicine

## 2023-07-08 DIAGNOSIS — Z0000 Encounter for general adult medical examination without abnormal findings: Secondary | ICD-10-CM

## 2023-11-25 ENCOUNTER — Other Ambulatory Visit: Payer: Self-pay | Admitting: Obstetrics and Gynecology

## 2023-11-25 ENCOUNTER — Other Ambulatory Visit (HOSPITAL_COMMUNITY)
Admission: RE | Admit: 2023-11-25 | Discharge: 2023-11-25 | Disposition: A | Discharge status: Home / Self Care | Source: Ambulatory Visit | Attending: Obstetrics and Gynecology | Admitting: Obstetrics and Gynecology

## 2023-11-25 DIAGNOSIS — Z124 Encounter for screening for malignant neoplasm of cervix: Secondary | ICD-10-CM | POA: Diagnosis present

## 2023-12-02 LAB — CYTOLOGY - PAP
Comment: NEGATIVE
Diagnosis: NEGATIVE
High risk HPV: NEGATIVE
